# Patient Record
Sex: Female | Born: 1950
Health system: Southern US, Community
[De-identification: ages and names within clinical notes are randomized; demographics above are authoritative.]

## PROBLEM LIST (undated history)

## (undated) DIAGNOSIS — E039 Hypothyroidism, unspecified: Secondary | ICD-10-CM

## (undated) DIAGNOSIS — K648 Other hemorrhoids: Secondary | ICD-10-CM

## (undated) DIAGNOSIS — I10 Essential (primary) hypertension: Secondary | ICD-10-CM

## (undated) DIAGNOSIS — K6389 Other specified diseases of intestine: Secondary | ICD-10-CM

## (undated) DIAGNOSIS — R51 Headache: Secondary | ICD-10-CM

## (undated) HISTORY — DX: Headache: R51

## (undated) HISTORY — DX: Essential (primary) hypertension: I10

## (undated) HISTORY — DX: Other hemorrhoids: K64.8

## (undated) HISTORY — DX: Hypothyroidism, unspecified: E03.9

## (undated) HISTORY — DX: Other specified diseases of intestine: K63.89

## (undated) HISTORY — PX: DIAGNOSTIC MAMMOGRAM: HXRAD719

---

## 1975-11-02 HISTORY — PX: TUBAL LIGATION: SHX77

## 2001-08-30 ENCOUNTER — Ambulatory Visit (HOSPITAL_COMMUNITY): Admission: RE | Admit: 2001-08-30 | Discharge: 2001-08-30 | Payer: Self-pay | Admitting: *Deleted

## 2001-08-30 ENCOUNTER — Encounter: Payer: Self-pay | Admitting: *Deleted

## 2004-09-03 ENCOUNTER — Ambulatory Visit (HOSPITAL_COMMUNITY): Admission: RE | Admit: 2004-09-03 | Discharge: 2004-09-03 | Payer: Self-pay | Admitting: *Deleted

## 2005-01-14 ENCOUNTER — Ambulatory Visit (HOSPITAL_COMMUNITY): Admission: RE | Admit: 2005-01-14 | Discharge: 2005-01-14 | Payer: Self-pay | Admitting: Internal Medicine

## 2005-01-14 ENCOUNTER — Other Ambulatory Visit: Admission: RE | Admit: 2005-01-14 | Discharge: 2005-01-14 | Payer: Self-pay | Admitting: *Deleted

## 2006-02-17 ENCOUNTER — Ambulatory Visit (HOSPITAL_COMMUNITY): Admission: RE | Admit: 2006-02-17 | Discharge: 2006-02-17 | Payer: Self-pay | Admitting: Internal Medicine

## 2006-08-03 ENCOUNTER — Ambulatory Visit (HOSPITAL_COMMUNITY): Admission: RE | Admit: 2006-08-03 | Discharge: 2006-08-03 | Payer: Self-pay | Admitting: Internal Medicine

## 2006-08-03 ENCOUNTER — Ambulatory Visit: Payer: Self-pay | Admitting: Cardiology

## 2007-03-09 ENCOUNTER — Ambulatory Visit (HOSPITAL_COMMUNITY): Admission: RE | Admit: 2007-03-09 | Discharge: 2007-03-09 | Payer: Self-pay | Admitting: Internal Medicine

## 2008-03-22 ENCOUNTER — Ambulatory Visit (HOSPITAL_COMMUNITY): Admission: RE | Admit: 2008-03-22 | Discharge: 2008-03-22 | Payer: Self-pay | Admitting: Internal Medicine

## 2008-06-04 ENCOUNTER — Ambulatory Visit (HOSPITAL_COMMUNITY): Admission: RE | Admit: 2008-06-04 | Discharge: 2008-06-04 | Payer: Self-pay | Admitting: Gastroenterology

## 2008-06-04 ENCOUNTER — Ambulatory Visit: Payer: Self-pay | Admitting: Gastroenterology

## 2008-06-04 ENCOUNTER — Encounter: Payer: Self-pay | Admitting: Gastroenterology

## 2008-06-04 HISTORY — PX: COLONOSCOPY: SHX174

## 2008-07-03 ENCOUNTER — Ambulatory Visit (HOSPITAL_COMMUNITY): Admission: RE | Admit: 2008-07-03 | Discharge: 2008-07-03 | Payer: Self-pay | Admitting: Internal Medicine

## 2009-04-14 ENCOUNTER — Ambulatory Visit (HOSPITAL_COMMUNITY): Admission: RE | Admit: 2009-04-14 | Discharge: 2009-04-14 | Payer: Self-pay | Admitting: Internal Medicine

## 2009-06-17 ENCOUNTER — Encounter: Payer: Self-pay | Admitting: Adult Health

## 2009-06-26 ENCOUNTER — Ambulatory Visit (HOSPITAL_COMMUNITY): Admission: RE | Admit: 2009-06-26 | Discharge: 2009-06-26 | Payer: Self-pay | Admitting: Internal Medicine

## 2009-06-27 ENCOUNTER — Encounter: Payer: Self-pay | Admitting: Cardiology

## 2009-06-27 ENCOUNTER — Ambulatory Visit: Payer: Self-pay

## 2009-06-27 ENCOUNTER — Encounter: Payer: Self-pay | Admitting: Adult Health

## 2009-06-27 DIAGNOSIS — R0602 Shortness of breath: Secondary | ICD-10-CM

## 2009-06-27 DIAGNOSIS — R944 Abnormal results of kidney function studies: Secondary | ICD-10-CM

## 2009-06-27 DIAGNOSIS — R1310 Dysphagia, unspecified: Secondary | ICD-10-CM | POA: Insufficient documentation

## 2009-06-27 DIAGNOSIS — R072 Precordial pain: Secondary | ICD-10-CM | POA: Insufficient documentation

## 2009-06-27 DIAGNOSIS — R5381 Other malaise: Secondary | ICD-10-CM

## 2009-06-27 DIAGNOSIS — R5383 Other fatigue: Secondary | ICD-10-CM

## 2009-07-04 ENCOUNTER — Ambulatory Visit (HOSPITAL_COMMUNITY): Admission: RE | Admit: 2009-07-04 | Discharge: 2009-07-04 | Payer: Self-pay | Admitting: Cardiology

## 2009-07-04 ENCOUNTER — Encounter: Payer: Self-pay | Admitting: Cardiology

## 2009-07-04 ENCOUNTER — Ambulatory Visit: Payer: Self-pay | Admitting: Cardiology

## 2009-07-04 LAB — CONVERTED CEMR LAB
ALT: 33 units/L (ref 0–35)
Alkaline Phosphatase: 80 units/L (ref 39–117)
Basophils Absolute: 0 10*3/uL (ref 0.0–0.1)
Basophils Relative: 1 % (ref 0–1)
CO2: 22 meq/L (ref 19–32)
Cholesterol: 189 mg/dL (ref 0–200)
Creatinine, Ser: 0.92 mg/dL (ref 0.40–1.20)
Eosinophils Absolute: 0.1 10*3/uL (ref 0.0–0.7)
Glucose, Bld: 95 mg/dL (ref 70–99)
MCHC: 32.5 g/dL (ref 30.0–36.0)
MCV: 88.2 fL (ref 78.0–100.0)
Monocytes Relative: 10 % (ref 3–12)
Neutrophils Relative %: 47 % (ref 43–77)
RBC: 5.09 M/uL (ref 3.87–5.11)
RDW: 12.5 % (ref 11.5–15.5)
Total Bilirubin: 1.2 mg/dL (ref 0.3–1.2)
Total CHOL/HDL Ratio: 6.3
Triglycerides: 148 mg/dL (ref <150)
VLDL: 30 mg/dL (ref 0–40)

## 2009-07-09 ENCOUNTER — Encounter: Payer: Self-pay | Admitting: Cardiology

## 2009-07-10 ENCOUNTER — Encounter (INDEPENDENT_AMBULATORY_CARE_PROVIDER_SITE_OTHER): Payer: Self-pay

## 2009-07-11 ENCOUNTER — Ambulatory Visit: Payer: Self-pay | Admitting: Cardiology

## 2009-07-11 DIAGNOSIS — R079 Chest pain, unspecified: Secondary | ICD-10-CM

## 2009-07-16 ENCOUNTER — Ambulatory Visit (HOSPITAL_COMMUNITY): Admission: RE | Admit: 2009-07-16 | Discharge: 2009-07-16 | Payer: Self-pay | Admitting: Cardiology

## 2009-07-21 ENCOUNTER — Encounter (INDEPENDENT_AMBULATORY_CARE_PROVIDER_SITE_OTHER): Payer: Self-pay | Admitting: *Deleted

## 2009-11-01 HISTORY — PX: CATARACT EXTRACTION: SUR2

## 2009-11-25 LAB — HM PAP SMEAR: HM Pap smear: NORMAL

## 2010-03-17 ENCOUNTER — Encounter (INDEPENDENT_AMBULATORY_CARE_PROVIDER_SITE_OTHER): Payer: Self-pay | Admitting: *Deleted

## 2010-07-14 ENCOUNTER — Ambulatory Visit (HOSPITAL_COMMUNITY): Admission: RE | Admit: 2010-07-14 | Discharge: 2010-07-14 | Payer: Self-pay | Admitting: Ophthalmology

## 2010-08-05 ENCOUNTER — Encounter (INDEPENDENT_AMBULATORY_CARE_PROVIDER_SITE_OTHER): Payer: Self-pay | Admitting: *Deleted

## 2010-08-18 ENCOUNTER — Ambulatory Visit (HOSPITAL_COMMUNITY)
Admission: RE | Admit: 2010-08-18 | Discharge: 2010-08-18 | Payer: Self-pay | Source: Home / Self Care | Admitting: Ophthalmology

## 2010-08-21 ENCOUNTER — Ambulatory Visit (HOSPITAL_COMMUNITY): Admission: RE | Admit: 2010-08-21 | Discharge: 2010-08-21 | Payer: Self-pay | Admitting: Internal Medicine

## 2010-12-01 NOTE — Letter (Signed)
Summary: Appointment - Reminder 2  [facility] [place]. [address], [place] [address]   Phone: [phone]  Fax: [phone]     Mar 17, 2010 MRN: [id number]   [name] [address] [place], [place]  [address]   Dear Ms. [facility],  Our records indicate that it is time to schedule a follow-up appointment.  Dr. Diona Browner         recommended that you follow up with [place] in      12/2009      . It is very important that we reach you to schedule this appointment. We look forward to participating in your health care needs. Please contact us at the number listed above at your earliest convenience to schedule your appointment.  If you are unable to make an appointment at this time, give [place] a call so we can update our records.     Sincerely,   Glass blower/designer

## 2010-12-01 NOTE — Letter (Signed)
Summary: Appointment - Reminder 2  [facility] [place]. [address], [place] [address]   Phone: [phone]  Fax: [phone]     August 05, 2010 MRN: [id number]   [name] [address] [place], [place]  [address]   Dear Ms. [facility],  Our records indicate that it is time to schedule a follow-up appointment.  Dr.  Diona Browner        recommended that you follow up with [place] in   12/2009         . It is very important that we reach you to schedule this appointment. We look forward to participating in your health care needs. Please contact us at the number listed above at your earliest convenience to schedule your appointment.  If you are unable to make an appointment at this time, give [place] a call so we can update our records.     Sincerely,   Glass blower/designer

## 2011-03-16 NOTE — Op Note (Signed)
[name], [name]              ACCOUNT NO.:  [id number]   MEDICAL RECORD NO.:  [id number]          PATIENT TYPE:  AMB   LOCATION:  DAY                           FACILITY:  [facility]   PHYSICIAN:  Kassie Mends, M.D.      DATE OF BIRTH:  December 28, 1950   DATE OF PROCEDURE:  06/04/2008  DATE OF DISCHARGE:                               OPERATIVE REPORT   REFERRING PHYSICIAN:  Tesfaye D. Fanta, MD.   PROCEDURE:  Colonoscopy with cold forceps and snare cautery polypectomy.   INDICATIONS FOR EXAMINATION:  [name] is a  60 year old female who  presents for average risk colon cancer screening.   FINDINGS:  1. A 1-cm flat ascending colon polyp removed via snare cautery.  A 4-      mm sessile, ascending polyp removed via cold forceps.  Otherwise,      no masses, inflammatory changes, diverticular AVM seen.  2. Melanosis coli.  3. Moderate internal hemorrhoids.  Otherwise, normal retroflexed view      of the rectum.   RECOMMENDATIONS:  1. Will call Amy. Avila with the results of her biopsies.  If she has      evidence of advanced features in her adenoma then she should have      the screening colonoscopy in 3 years.  2. She should follow a high-fiber diet.  She is given a handout on      high-fiber diet, polyps, and hemorrhoids.  3. No aspirin, NSAIDs, or anticoagulation for 7 days.   MEDICATIONS:  1. Demerol 25 mg IV.  2. Versed 3 mg IV.   PROCEDURE TECHNIQUE:  Physical exam was performed.  Informed consent was  obtained from the patient, after explaining the benefits, risks, and  alternatives to the procedure.  The patient was connected to the monitor  and placed in the left lateral position.  Continuous oxygen was provided  by nasal cannula.  IV medicine administered through an indwelling  cannula.  After administration of sedation and rectal exam, the  patient's rectum was intubated and the scope was advanced under direct  visualization to the cecum.  The scope was removed slowly by  carefully  examining the color, texture, anatomy, and integrity of the mucosa on  the way out.  The patient was recovered in endoscopy and discharged home  in satisfactory condition.   PATH:  Tubulovillous adenoma. TCS in 3 years. High fiber diet.      Kassie Mends, M.D.  Electronically Signed     SM/MEDQ  D:  06/04/2008  T:  06/05/2008  Job:  [id number]   cc:   Tesfaye D. Felecia Shelling, MD  Fax: [phone]

## 2011-03-19 NOTE — Procedures (Signed)
[name], [name] NO.:  [id number]   MEDICAL RECORD NO.:  [id number]          PATIENT TYPE:  OUT   LOCATION:  RAD                           FACILITY:  [facility]   PHYSICIAN:  Gerrit Friends. Dietrich Pates, MD, FACCDATE OF BIRTH:  1951/02/10   DATE OF PROCEDURE:  08/03/2006  DATE OF DISCHARGE:                                  ECHOCARDIOGRAM   CLINICAL DATA:  This is a 60 year old woman with dyspnea.   M-Mode:  Aorta 2.9, left atrium 4.5, septum 1.5, posterior wall 1.0, LV  diastole 3.8, LV systole 2.8.   1. Technically suboptimal but adequate echocardiographic study.  2. Mild right and left atrial enlargement; normal right ventricular size      and function.  3. Normal trileaflet aortic valve; mild-to-moderate calcification of the      proximal ascending aorta.  4. Normal mitral valve; minimal annular calcification.  5. Normal tricuspid valve; physiologic regurgitation; normal estimated RV      systolic pressure.  6. Normal pulmonic valve and proximal pulmonary artery.  7. Normal left ventricular size; mild hypertrophy with disproportionate      septal thickening; normal regional and global function.  8. IVC is grossly normal.      Gerrit Friends. Dietrich Pates, MD, [facility]  Electronically Signed     RMR/MEDQ  D:  08/03/2006  T:  08/04/2006  Job:  [id number]

## 2011-07-08 ENCOUNTER — Ambulatory Visit (HOSPITAL_COMMUNITY)
Admission: RE | Admit: 2011-07-08 | Discharge: 2011-07-08 | Disposition: A | Discharge status: Home / Self Care | Payer: 59 | Source: Ambulatory Visit | Attending: Orthopaedic Surgery | Admitting: Orthopaedic Surgery

## 2011-07-08 DIAGNOSIS — M546 Pain in thoracic spine: Secondary | ICD-10-CM | POA: Insufficient documentation

## 2011-07-08 DIAGNOSIS — M25519 Pain in unspecified shoulder: Secondary | ICD-10-CM | POA: Insufficient documentation

## 2011-07-08 DIAGNOSIS — M6281 Muscle weakness (generalized): Secondary | ICD-10-CM | POA: Insufficient documentation

## 2011-07-08 DIAGNOSIS — M25619 Stiffness of unspecified shoulder, not elsewhere classified: Secondary | ICD-10-CM | POA: Insufficient documentation

## 2011-07-08 DIAGNOSIS — IMO0001 Reserved for inherently not codable concepts without codable children: Secondary | ICD-10-CM | POA: Insufficient documentation

## 2011-07-08 NOTE — Progress Notes (Cosign Needed)
Physical Therapy Evaluation  Patient Details  Name: [name] MRN: [id number] Date of Birth: 01/10/1951  Today's Date: 07/08/2011 Time: 1600-1700 Time Calculation (min): 60 min Visit#: 1 of 4 Re-eval: 08/07/11  Past Medical History: No past medical history on file. Past Surgical History: No past surgical history on file.  Subjective Symptoms/Limitations Symptoms: [name] states that she is having a knawing ache under her left shoulder blade since the first week of July.  She states since July she has only had three days of being painfree.  The patient states that she had her heart checked out and all tests were negative.  The patient states that she was put on anti'inflammatory  with 20% relief.  The pat states that she was given a muscle relaxor from Dr. Hilda Avila which gave her 60-70 % relief. How long can you sit comfortably?: no problem How long can you stand comfortably?: no problem How long can you walk comfortably?: no problem Pain Assessment Currently in Pain?: Yes Pain Location: Shoulder (shoulderblade.) Pain Orientation: Left Pain Relieving Factors: medication.l  Precautions/Restrictions  Restrictions Weight Bearing Restrictions: No  Prior Functioning  Home Living Type of Home: House Lives With: Spouse Prior Function Level of Independence: Independent with basic ADLs Driving: Yes Able to Take Stairs Reciprically: Yes Vocation: Full time employment Leisure: Hobbies-no  Cognition Cognition Overall Cognitive Status: Appears within functional limits for tasks assessed Arousal/Alertness: Awake/alert Orientation Level: Oriented X4  Sensation/Coordination/Flexibility    Assessment LUE Strength Left Shoulder Flexion: 4/5 Left Shoulder ABduction: 4/5 Left Shoulder Internal Rotation: 4/5 Left Shoulder External Rotation: 3+/5 RLE Assessment RLE Assessment: Within Functional Limits  Mobility (including Balance)       Exercise/Treatments- Pt  completed right side-lying abduction,ER; prone shld retraction and self mob with dowel. Modalities Modalities: Ultrasound Manual Therapy Manual Therapy: Scapular mobilization Scapular Mobilization: mob to T5-6 Ultrasound Ultrasound Location: LT5-7 Ultrasound Parameters: 1mg HZ 1.3 w/cm2 c 8' Ultrasound Goals: Pain  Physical Therapy Assessment and Plan PT Assessment and Plan Clinical Impression Statement: Pt with mild paraspinal spasm with decreased rotation of T5-6 who will benefit from skilled therapy to reduce pain Rehab Potential: Good PT Frequency: Min 1X/week PT Duration: 4 weeks PT Treatment/Interventions: Therapeutic exercise;Other (comment) (manual and modalities for pain) PT Plan: Begin chest stretch, T-Band exercise and chest stretch next treatment.    Goals PT Short Term Goals Time to Complete Goals: 2 weeks PT Short Term Goal 1: I HEP PT Short Term Goal 2: Pain decreased to no more than an 8/10 PT Long Term Goals PT Long Term Goal 1: 4 wk- pain no higher than a 2/10 PT Long Term Goal 2: strength wnl Long Term Goal 3: I in advance HEP  Problem List Patient Active Problem List  Diagnoses  . PE  . FATIGUE / MALAISE  . DYSPNEA  . CHEST PAIN  . CHEST PAIN-PRECORDIAL  . DYSPHAGIA UNSPECIFIED  . NONSPECIFIC ABNORM RESULTS KIDNEY FUNCTION STUDY  . PREHYPERTENSION  . Pain in thoracic spine    PT - End of Session Activity Tolerance: Patient tolerated treatment well General Behavior During Session: [facility] for tasks performed Cognition: [facility] for tasks performed   Mclain Freer,CINDY 07/08/2011, 5:15 PM  Physician Documentation Your signature is required to indicate approval of the treatment plan as stated above.  Please sign and either send electronically or make a copy of this report for your files and return this physician signed original.   Please mark one 1.__approve of plan  2. ___approve of plan with the  following conditions.   ______________________________                                                           _____________________ Physician Signature                                                                                                             Date

## 2011-07-08 NOTE — Patient Instructions (Addendum)
HEP

## 2011-07-15 ENCOUNTER — Inpatient Hospital Stay (HOSPITAL_COMMUNITY): Admission: RE | Admit: 2011-07-15 | Payer: 59 | Source: Ambulatory Visit | Admitting: Physical Therapy

## 2011-07-15 ENCOUNTER — Telehealth (HOSPITAL_COMMUNITY): Payer: Self-pay

## 2011-07-16 ENCOUNTER — Ambulatory Visit (HOSPITAL_COMMUNITY): Payer: 59 | Admitting: Physical Therapy

## 2011-07-23 ENCOUNTER — Inpatient Hospital Stay (HOSPITAL_COMMUNITY): Admission: RE | Admit: 2011-07-23 | Payer: 59 | Source: Ambulatory Visit | Admitting: Physical Therapy

## 2011-07-30 ENCOUNTER — Telehealth (HOSPITAL_COMMUNITY): Payer: Self-pay

## 2011-07-30 ENCOUNTER — Inpatient Hospital Stay (HOSPITAL_COMMUNITY): Admission: RE | Admit: 2011-07-30 | Payer: 59 | Source: Ambulatory Visit | Admitting: Physical Therapy

## 2011-08-02 LAB — HM MAMMOGRAPHY: HM Mammogram: NEGATIVE

## 2011-09-08 ENCOUNTER — Other Ambulatory Visit (HOSPITAL_COMMUNITY): Payer: Self-pay | Admitting: Internal Medicine

## 2011-09-08 DIAGNOSIS — Z1231 Encounter for screening mammogram for malignant neoplasm of breast: Secondary | ICD-10-CM

## 2011-09-14 ENCOUNTER — Emergency Department (HOSPITAL_COMMUNITY)
Admission: EM | Admit: 2011-09-14 | Discharge: 2011-09-14 | Disposition: A | Discharge status: Home / Self Care | Payer: 59 | Source: Home / Self Care | Attending: Family Medicine | Admitting: Family Medicine

## 2011-09-14 ENCOUNTER — Emergency Department (INDEPENDENT_AMBULATORY_CARE_PROVIDER_SITE_OTHER): Payer: 59

## 2011-09-14 ENCOUNTER — Encounter: Payer: Self-pay | Admitting: Cardiology

## 2011-09-14 DIAGNOSIS — IMO0001 Reserved for inherently not codable concepts without codable children: Secondary | ICD-10-CM

## 2011-09-14 DIAGNOSIS — M791 Myalgia, unspecified site: Secondary | ICD-10-CM

## 2011-09-14 MED ORDER — HYDROCODONE-ACETAMINOPHEN 5-325 MG PO TABS: ORAL_TABLET | ORAL | Status: AC

## 2011-09-14 MED ORDER — NAPROXEN 375 MG PO TABS: 375.0000 mg | ORAL_TABLET | Freq: Two times a day (BID) | ORAL | Status: DC

## 2011-09-14 NOTE — ED Notes (Signed)
Pt co back pain today that started in August that has gotten progressively worse. Pain goes into left shoulder blade into left breast. Pt reports constipation. Pt able to do ADL's but the gets worse by the end of the day.

## 2011-09-14 NOTE — ED Provider Notes (Signed)
History     CSN: [id number] Arrival date & time: 09/14/2011  5:30 PM   First MD Initiated Contact with Patient 09/14/11 1814      No chief complaint on file.   (Consider location/radiation/quality/duration/timing/severity/associated sxs/prior treatment) HPI Comments: Jashayla presents for evaluation of LEFT shoulder pain, shoulder blade pain, that has been worsening over time. She relates the onset of pain to carrying a "homemade pole" that she was carrying during exercises. She denies any numbness and tingling.  She states that the pain now radiates through to her LEFT breast, described as sharp, shooting.  Patient is a 60 y.o. female presenting with back pain.  Back Pain  This is a new problem. The current episode started more than 1 week ago. The problem occurs constantly. The pain is associated with no known injury. The quality of the pain is described as stabbing. The pain is at a severity of 9/10. The pain is severe. The symptoms are aggravated by certain positions. The pain is the same all the time. Associated symptoms include abdominal pain. Pertinent negatives include no paresthesias, no paresis, no tingling and no weakness. She has tried analgesics for the symptoms. The treatment provided moderate relief.    No past medical history on file.  No past surgical history on file.  No family history on file.  History  Substance Use Topics  . Smoking status: Not on file  . Smokeless tobacco: Not on file  . Alcohol Use: Not on file    OB History    No data available      Review of Systems  Constitutional: Negative.   HENT: Negative.   Eyes: Negative.   Respiratory: Negative.   Gastrointestinal: Positive for abdominal pain.  Musculoskeletal: Positive for back pain.  Neurological: Negative for tingling, weakness and paresthesias.    Allergies  Review of patient's allergies indicates not on file.  Home Medications  No current outpatient prescriptions on file.  BP  120/84  Pulse 76  Temp(Src) 98.1 F (36.7 C) (Oral)  Resp 18  SpO2 100%  Physical Exam  Constitutional: She is oriented to person, place, and time.  HENT:  Head: Normocephalic and atraumatic.  Pulmonary/Chest: Effort normal and breath sounds normal. She has no wheezes. She has no rhonchi. She has no rales.  Musculoskeletal:       Left shoulder: She exhibits normal range of motion, no tenderness, no bony tenderness and normal strength.       Thoracic back: She exhibits tenderness, bony tenderness and pain.       LEFT shoulder: full abduction, adduction, flexion, and extension; 5/5 strength with internal and external rotation; 5/5 grip strength; negative Hawkin's, negative Ober's, negative Neer's, negative Speed's test  Tenderness to palpation over lower ribs beneath LEFT scapula  Neurological: She is alert and oriented to person, place, and time.  Skin: Skin is warm and dry.    ED Course  Procedures (including critical care time)  Labs Reviewed - No data to display No results found.   No diagnosis found.    MDM  Xray: no acute findings; no evidence of fracture or cardiopulmonary disease        Richardo Priest, MD 09/14/11 1944

## 2011-10-08 ENCOUNTER — Ambulatory Visit (HOSPITAL_COMMUNITY)
Admission: RE | Admit: 2011-10-08 | Discharge: 2011-10-08 | Disposition: A | Discharge status: Home / Self Care | Payer: 59 | Source: Ambulatory Visit | Attending: Internal Medicine | Admitting: Internal Medicine

## 2011-10-08 DIAGNOSIS — Z1231 Encounter for screening mammogram for malignant neoplasm of breast: Secondary | ICD-10-CM | POA: Insufficient documentation

## 2011-10-29 ENCOUNTER — Encounter: Payer: Self-pay | Admitting: Cardiology

## 2011-12-06 ENCOUNTER — Telehealth: Payer: Self-pay

## 2011-12-06 NOTE — Telephone Encounter (Signed)
Pt referred from Dr. Felecia Shelling for colonoscopy. Hx of tubular adenoma  06/04/2008  ( Dr. Darrick Penna). LMOM that pt will need OV prior to colonoscopy. LMOM to call.

## 2011-12-09 ENCOUNTER — Ambulatory Visit (INDEPENDENT_AMBULATORY_CARE_PROVIDER_SITE_OTHER): Payer: 59 | Admitting: Gastroenterology

## 2011-12-09 ENCOUNTER — Encounter: Payer: Self-pay | Admitting: Gastroenterology

## 2011-12-09 VITALS — BP 108/73 | HR 74 | Temp 98.2°F | Ht 67.0 in | Wt 184.6 lb

## 2011-12-09 DIAGNOSIS — K5900 Constipation, unspecified: Secondary | ICD-10-CM | POA: Insufficient documentation

## 2011-12-09 DIAGNOSIS — Z86010 Personal history of colonic polyps: Secondary | ICD-10-CM | POA: Insufficient documentation

## 2011-12-09 MED ORDER — POLYETHYLENE GLYCOL 3350 17 GM/SCOOP PO POWD: 17.0000 g | Freq: Every day | ORAL | Status: AC

## 2011-12-09 MED ORDER — PEG-KCL-NACL-NASULF-NA ASC-C 100 G PO SOLR: 1.0000 | Freq: Once | ORAL | Status: DC

## 2011-12-09 NOTE — Patient Instructions (Signed)
We have scheduled you for a colonoscopy. Please see separate instructions.  Please start Miralax 17g daily. First three days, take twice daily. I sent RX to [facility] but you may be instructed to buy OTC.  Constipation Constipation is when you poop (have a bowel movement) less than 3 times a week. Sometimes the poop (stool) is small and hard. There are many different causes of constipation.  HOME CARE   Go to the bathroom when you feel the urge to go.   Drink enough fluid to keep your pee (urine) clear or pale yellow.   Eat more high-fiber foods.   Drink prune juice or eat stewed fruits in the morning.   Exercise.   Ask your doctor if you should take medicine to help you poop or take fiber supplements.  GET HELP RIGHT AWAY IF:   You see blood in your poop or in the toilet.   Your belly (abdomen) gets hard and puffy (swollen).   You keep throwing up (vomiting).   You have a lot of pain.  MAKE SURE YOU:   Understand these instructions.   Will watch your condition.   Will get help right away if you are not doing well or get worse.  Document Released: 04/05/2008 Document Revised: 06/30/2011 Document Reviewed: 04/05/2008 [facility] Patient Information 2012 [place], [place].

## 2011-12-09 NOTE — Assessment & Plan Note (Signed)
Add miralax 17g po bid for three days, then daily. HO provided. Colonoscopy as planned.

## 2011-12-09 NOTE — Progress Notes (Signed)
Primary Care Physician:  Avon Gully, MD, MD  Primary Gastroenterologist:  Jonette Eva, MD   Chief Complaint  Patient presents with  . Colonoscopy    not feling well    HPI:  [name] is a 61 y.o. female here to schedule her surveillance colonoscopy. She had tubulovillous adenoma removed from ascending colon in 2009. Recommend to have three year follow-up TCS.  Weight down 13 pounds in couple of months. Only change is that she eliminated soft drinks. Not feeling well for last couple of months. Bowels will not empting well, sore in rectum, constipation. No naproxen since summer. Recent TSH ok. Chronic constipation, but now really bad. May go all week until takes Smooth Move Green Tea on Thursday. After good BMs still feels bad. No melena, brbpr, n/v, heartburn, dysphagia. LLQ pain.    Current Outpatient Prescriptions  Medication Sig Dispense Refill  . HYDROcodone-acetaminophen (NORCO) 5-325 MG per tablet Take 1 tablet by mouth every 6 (six) hours as needed.        Marland Kitchen levothyroxine (SYNTHROID, LEVOTHROID) 112 MCG tablet Take 112 mcg by mouth daily.        Marland Kitchen lisinopril-hydrochlorothiazide (PRINZIDE,ZESTORETIC) 10-12.5 MG per tablet Take 1 tablet by mouth daily.        .       11    Allergies as of 12/09/2011  . (No Known Allergies)    Past Medical History  Diagnosis Date  . Hypothyroidism   . Tubulovillous adenoma 06/2008    1cm flat ascending colon  . Internal hemorrhoids   . Melanosis coli   . Headache   . HTN (hypertension)     Past Surgical History  Procedure Date  . Tubal ligation 1977  . Vaginal delivery   . Diagnostic mammogram   . Colonoscopy 06/04/08    moderate internal hemorrhoids/melanosis coli/tubulovillous adenoma 1cm flat    Family History  Problem Relation Age of Onset  . Colon cancer Neg Hx   . Breast cancer Sister     two, one deceased  . Pancreatic cancer Mother     deceased, 84  . Lung cancer Neg Hx   . Ovarian cancer Neg Hx      History   Social History  . Marital Status: Widowed    Spouse Name: N/A    Number of Children: 1  . Years of Education: N/A   Occupational History  . Customer service [place]   Social History Main Topics  . Smoking status: Never Smoker   . Smokeless tobacco: Not on file  . Alcohol Use: No  . Drug Use: No  . Sexually Active:    Other Topics Concern  . Not on file   Social History Narrative  . No narrative on file      ROS:  General: Negative for anorexia, weight loss, fever, chills, fatigue, weakness. Eyes: Negative for vision changes.  ENT: Negative for hoarseness, difficulty swallowing , nasal congestion. CV: Negative for chest pain, angina, palpitations, dyspnea on exertion, peripheral edema.  Respiratory: Negative for dyspnea at rest, dyspnea on exertion, cough, sputum, wheezing.  GI: See history of present illness. GU:  Negative for dysuria, hematuria, urinary incontinence, urinary frequency, nocturnal urination.  MS: Negative for joint pain, low back pain.  Derm: Negative for rash or itching.  Neuro: Negative for weakness, abnormal sensation, seizure, frequent headaches, memory loss, confusion.  Psych: Negative for anxiety, depression, suicidal ideation, hallucinations.  Endo: Negative for unusual weight change.  Heme: Negative for bruising or  bleeding. Allergy: Negative for rash or hives.    Physical Examination:  BP 108/73  Pulse 74  Temp(Src) 98.2 F (36.8 C) (Temporal)  Ht 5\' 7"  (1.702 m)  Wt 184 lb 9.6 oz (83.734 kg)  BMI 28.91 kg/m2   General: Well-nourished, well-developed in no acute distress.  Head: Normocephalic, atraumatic.   Eyes: Conjunctiva pink, no icterus. Mouth: Oropharyngeal mucosa moist and pink , no lesions erythema or exudate. Neck: Supple without thyromegaly, masses, or lymphadenopathy.  Lungs: Clear to auscultation bilaterally.  Heart: Regular rate and rhythm, no murmurs rubs or gallops.  Abdomen: Bowel sounds are  normal, mild LLQ tenderness, nondistended, no hepatosplenomegaly or masses, no abdominal bruits or hernia , no rebound or guarding.   Rectal: Defer to time of TCS. Extremities: No lower extremity edema. No clubbing or deformities.  Neuro: Alert and oriented x 4 , grossly normal neurologically.  Skin: Warm and dry, no rash or jaundice.   Psych: Alert and cooperative, normal mood and affect.

## 2011-12-09 NOTE — Assessment & Plan Note (Signed)
Due for surveillance colonoscopy.  I have discussed the risks, alternatives, benefits with regards to but not limited to the risk of reaction to medication, bleeding, infection, perforation and the patient is agreeable to proceed. Written consent to be obtained.  

## 2011-12-09 NOTE — Progress Notes (Signed)
Faxed to PCP

## 2011-12-16 MED ORDER — SODIUM CHLORIDE 0.45 % IV SOLN
Freq: Once | INTRAVENOUS | Status: AC
  Administered 2011-12-17: 12:00:00 via INTRAVENOUS

## 2011-12-17 ENCOUNTER — Encounter (HOSPITAL_COMMUNITY)
Admission: RE | Disposition: A | Discharge status: Home / Self Care | Payer: Self-pay | Source: Ambulatory Visit | Attending: Gastroenterology

## 2011-12-17 ENCOUNTER — Ambulatory Visit (HOSPITAL_COMMUNITY)
Admission: RE | Admit: 2011-12-17 | Discharge: 2011-12-17 | Disposition: A | Discharge status: Home / Self Care | Payer: 59 | Source: Ambulatory Visit | Attending: Gastroenterology | Admitting: Gastroenterology

## 2011-12-17 ENCOUNTER — Other Ambulatory Visit: Payer: Self-pay | Admitting: Gastroenterology

## 2011-12-17 ENCOUNTER — Encounter (HOSPITAL_COMMUNITY): Payer: Self-pay | Admitting: *Deleted

## 2011-12-17 DIAGNOSIS — R634 Abnormal weight loss: Secondary | ICD-10-CM | POA: Insufficient documentation

## 2011-12-17 DIAGNOSIS — R198 Other specified symptoms and signs involving the digestive system and abdomen: Secondary | ICD-10-CM

## 2011-12-17 DIAGNOSIS — Z79899 Other long term (current) drug therapy: Secondary | ICD-10-CM | POA: Insufficient documentation

## 2011-12-17 DIAGNOSIS — D126 Benign neoplasm of colon, unspecified: Secondary | ICD-10-CM | POA: Insufficient documentation

## 2011-12-17 DIAGNOSIS — Z860100 Personal history of colon polyps, unspecified: Secondary | ICD-10-CM | POA: Insufficient documentation

## 2011-12-17 DIAGNOSIS — I10 Essential (primary) hypertension: Secondary | ICD-10-CM | POA: Insufficient documentation

## 2011-12-17 DIAGNOSIS — K648 Other hemorrhoids: Secondary | ICD-10-CM

## 2011-12-17 HISTORY — PX: COLONOSCOPY: SHX5424

## 2011-12-17 SURGERY — COLONOSCOPY
Anesthesia: Moderate Sedation

## 2011-12-17 MED ORDER — MIDAZOLAM HCL 5 MG/5ML IJ SOLN
INTRAMUSCULAR | Status: DC | PRN
  Administered 2011-12-17 (×2): 1 mg via INTRAVENOUS
  Administered 2011-12-17: 2 mg via INTRAVENOUS

## 2011-12-17 MED ORDER — LUBIPROSTONE 24 MCG PO CAPS: ORAL_CAPSULE | ORAL | Status: AC

## 2011-12-17 MED ORDER — STERILE WATER FOR IRRIGATION IR SOLN
Status: DC | PRN
  Administered 2011-12-17: 12:00:00

## 2011-12-17 MED ORDER — MEPERIDINE HCL 100 MG/ML IJ SOLN
INTRAMUSCULAR | Status: DC | PRN
  Administered 2011-12-17: 25 mg via INTRAVENOUS

## 2011-12-17 MED ORDER — MEPERIDINE HCL 100 MG/ML IJ SOLN
INTRAMUSCULAR | Status: AC
  Filled 2011-12-17: qty 2

## 2011-12-17 MED ORDER — MIDAZOLAM HCL 5 MG/5ML IJ SOLN
INTRAMUSCULAR | Status: AC
  Filled 2011-12-17: qty 10

## 2011-12-17 NOTE — Op Note (Signed)
[facility] [address] [place], [place]  [address]  COLONOSCOPY PROCEDURE REPORT  PATIENT:  Amy, Avila  MR#:  [id number] BIRTHDATE:  09/04/1951, 60 yrs. old  GENDER:  female  ENDOSCOPIST:  Jonette Eva, MD REF. BY:  Glenice Laine, M.D. ASSISTANT:  PROCEDURE DATE:  12/17/2011 PROCEDURE:  ILEOColonoscopy with biopsy  INDICATIONS:  CHANGE IN BOWEL HABITS, WEIGHT LOSS, XB:JYNWGNFA POLYP 2009  MEDICATIONS:   Demerol 25 mg IV, Versed 4 mg IV  DESCRIPTION OF PROCEDURE:    Physical exam was performed. Informed consent was obtained from the patient after explaining the benefits, risks, and alternatives to procedure.  The patient was connected to monitor and placed in left lateral position. Continuous oxygen was provided by nasal cannula and IV medicine administered through an indwelling cannula.  After administration of sedation and rectal exam, the patient's rectum was intubated and the EC-[id number] ([id number]) colonoscope was advanced under direct visualization to the ILEUM.  The scope was removed slowly by carefully examining the color, texture, anatomy, and integrity mucosa on the way out.  The patient was recovered in endoscopy and discharged home in satisfactory condition. <<PROCEDUREIMAGES>>  FINDINGS:  A 4 MM sessile polyp was found in the descending colon 7 REMOVED VIA COLD FORCEPS. SMALL  Internal Hemorrhoids were found. NL ILEUM  10 CM VISUALIZED.  PREP QUALITY: EXCELLENT CECAL W/D TIME:     13 minutes  COMPLICATIONS:    None  ENDOSCOPIC IMPRESSION: 1) Sessile polyp in the descending colon 2) Internal hemorrhoids  RECOMMENDATIONS: TCS IN 5 YEARS HIGH FIBER DIET ADD AMITIZA AWAIT BIOPSIES OPV IN 4 MOS DRINK WATER  REPEAT EXAM:  No  ______________________________ Jonette Eva, MD  CC:  Glenice Laine, M.D.  n. eSIGNED:   Shakiah Wester at 12/17/2011 02:02 PM  Raford Pitcher, [phone]

## 2011-12-17 NOTE — Interval H&P Note (Signed)
History and Physical Interval Note:  12/17/2011 12:26 PM  [name]  has presented today for surgery, with the diagnosis of hx of polyps, constipation  The various methods of treatment have been discussed with the patient and family. After consideration of risks, benefits and other options for treatment, the patient has consented to  Procedure(s) (LRB): COLONOSCOPY (N/A) as a surgical intervention .  The patients' history has been reviewed, patient examined, no change in status, stable for surgery.  I have reviewed the patients' chart and labs.  Questions were answered to the patient's satisfaction.     [employer]

## 2011-12-17 NOTE — H&P (View-Only) (Signed)
Primary Care Physician:  FANTA,TESFAYE, MD, MD  Primary Gastroenterologist:  Sandi Fields, MD   Chief Complaint  Patient presents with  . Colonoscopy    not feling well    HPI:  [name] is a 61 y.o. female here to schedule her surveillance colonoscopy. She had tubulovillous adenoma removed from ascending colon in 2009. Recommend to have three year follow-up TCS.  Weight down 13 pounds in couple of months. Only change is that she eliminated soft drinks. Not feeling well for last couple of months. Bowels will not empting well, sore in rectum, constipation. No naproxen since summer. Recent TSH ok. Chronic constipation, but now really bad. May go all week until takes Smooth Move Green Tea on Thursday. After good BMs still feels bad. No melena, brbpr, n/v, heartburn, dysphagia. LLQ pain.    Current Outpatient Prescriptions  Medication Sig Dispense Refill  . HYDROcodone-acetaminophen (NORCO) 5-325 MG per tablet Take 1 tablet by mouth every 6 (six) hours as needed.        . levothyroxine (SYNTHROID, LEVOTHROID) 112 MCG tablet Take 112 mcg by mouth daily.        . lisinopril-hydrochlorothiazide (PRINZIDE,ZESTORETIC) 10-12.5 MG per tablet Take 1 tablet by mouth daily.        .       11    Allergies as of 12/09/2011  . (No Known Allergies)    Past Medical History  Diagnosis Date  . Hypothyroidism   . Tubulovillous adenoma 06/2008    1cm flat ascending colon  . Internal hemorrhoids   . Melanosis coli   . Headache   . HTN (hypertension)     Past Surgical History  Procedure Date  . Tubal ligation 1977  . Vaginal delivery   . Diagnostic mammogram   . Colonoscopy 06/04/08    moderate internal hemorrhoids/melanosis coli/tubulovillous adenoma 1cm flat    Family History  Problem Relation Age of Onset  . Colon cancer Neg Hx   . Breast cancer Sister     two, one deceased  . Pancreatic cancer Mother     deceased, 85  . Lung cancer Neg Hx   . Ovarian cancer Neg Hx      History   Social History  . Marital Status: Widowed    Spouse Name: N/A    Number of Children: 1  . Years of Education: N/A   Occupational History  . Customer service [employer]   Social History Main Topics  . Smoking status: Never Smoker   . Smokeless tobacco: Not on file  . Alcohol Use: No  . Drug Use: No  . Sexually Active:    Other Topics Concern  . Not on file   Social History Narrative  . No narrative on file      ROS:  General: Negative for anorexia, weight loss, fever, chills, fatigue, weakness. Eyes: Negative for vision changes.  ENT: Negative for hoarseness, difficulty swallowing , nasal congestion. CV: Negative for chest pain, angina, palpitations, dyspnea on exertion, peripheral edema.  Respiratory: Negative for dyspnea at rest, dyspnea on exertion, cough, sputum, wheezing.  GI: See history of present illness. GU:  Negative for dysuria, hematuria, urinary incontinence, urinary frequency, nocturnal urination.  MS: Negative for joint pain, low back pain.  Derm: Negative for rash or itching.  Neuro: Negative for weakness, abnormal sensation, seizure, frequent headaches, memory loss, confusion.  Psych: Negative for anxiety, depression, suicidal ideation, hallucinations.  Endo: Negative for unusual weight change.  Heme: Negative for bruising or   bleeding. Allergy: Negative for rash or hives.    Physical Examination:  BP 108/73  Pulse 74  Temp(Src) 98.2 F (36.8 C) (Temporal)  Ht 5' 7" (1.702 m)  Wt 184 lb 9.6 oz (83.734 kg)  BMI 28.91 kg/m2   General: Well-nourished, well-developed in no acute distress.  Head: Normocephalic, atraumatic.   Eyes: Conjunctiva pink, no icterus. Mouth: Oropharyngeal mucosa moist and pink , no lesions erythema or exudate. Neck: Supple without thyromegaly, masses, or lymphadenopathy.  Lungs: Clear to auscultation bilaterally.  Heart: Regular rate and rhythm, no murmurs rubs or gallops.  Abdomen: Bowel sounds are  normal, mild LLQ tenderness, nondistended, no hepatosplenomegaly or masses, no abdominal bruits or hernia , no rebound or guarding.   Rectal: Defer to time of TCS. Extremities: No lower extremity edema. No clubbing or deformities.  Neuro: Alert and oriented x 4 , grossly normal neurologically.  Skin: Warm and dry, no rash or jaundice.   Psych: Alert and cooperative, normal mood and affect.      

## 2011-12-23 ENCOUNTER — Encounter (HOSPITAL_COMMUNITY): Payer: Self-pay | Admitting: Gastroenterology

## 2011-12-23 NOTE — Telephone Encounter (Signed)
Called pt and LMOM for her to call and schedule an OV first.

## 2011-12-24 ENCOUNTER — Ambulatory Visit: Payer: 59 | Admitting: Gastroenterology

## 2011-12-24 ENCOUNTER — Telehealth: Payer: Self-pay | Admitting: Gastroenterology

## 2011-12-24 NOTE — Telephone Encounter (Signed)
Please call pt. She had A simple adenomas removed from her colon.   USE AMITIZA TO TREAT CONSTIPATION. USE MIRALAX AS NEEDED. FOLLOW A HIGH FIBER DIET. AVOID ITEMS THAT CAUSE BLOATING.  DRINK 6 TO 8 CUPS WATER DAILY. COLONOSCOPY IN 5 YEARS. OPV IN 4 MOS.

## 2011-12-27 NOTE — Telephone Encounter (Signed)
LMOM to call.

## 2011-12-28 NOTE — Telephone Encounter (Signed)
Pt had OV on 12/09/2011 and procedure on 12/17/2011.

## 2011-12-28 NOTE — Telephone Encounter (Signed)
LMOM to call. Letter mailed to call also.  

## 2011-12-29 NOTE — Telephone Encounter (Signed)
Results Cc to PCP & reminders are in the computer

## 2011-12-29 NOTE — Progress Notes (Signed)
REVIEWED.  

## 2012-01-24 LAB — HM COLONOSCOPY

## 2012-02-24 ENCOUNTER — Encounter: Payer: Self-pay | Admitting: Internal Medicine

## 2012-02-24 ENCOUNTER — Ambulatory Visit (INDEPENDENT_AMBULATORY_CARE_PROVIDER_SITE_OTHER): Payer: 59 | Admitting: Internal Medicine

## 2012-02-24 VITALS — BP 110/70

## 2012-02-24 DIAGNOSIS — E039 Hypothyroidism, unspecified: Secondary | ICD-10-CM | POA: Insufficient documentation

## 2012-02-24 DIAGNOSIS — Z0000 Encounter for general adult medical examination without abnormal findings: Secondary | ICD-10-CM

## 2012-02-24 DIAGNOSIS — I10 Essential (primary) hypertension: Secondary | ICD-10-CM

## 2012-02-24 LAB — CBC WITH DIFFERENTIAL/PLATELET
Eosinophils Relative: 2.8 % (ref 0.0–5.0)
HCT: 43.9 % (ref 36.0–46.0)
Hemoglobin: 14.4 g/dL (ref 12.0–15.0)
Lymphs Abs: 2.2 10*3/uL (ref 0.7–4.0)
MCV: 89.5 fl (ref 78.0–100.0)
Monocytes Absolute: 0.5 10*3/uL (ref 0.1–1.0)
Monocytes Relative: 8.8 % (ref 3.0–12.0)
Neutro Abs: 3 10*3/uL (ref 1.4–7.7)
Platelets: 251 10*3/uL (ref 150.0–400.0)
RDW: 12.7 % (ref 11.5–14.6)

## 2012-02-24 LAB — LIPID PANEL
Cholesterol: 213 mg/dL — ABNORMAL HIGH (ref 0–200)
Total CHOL/HDL Ratio: 6
Triglycerides: 185 mg/dL — ABNORMAL HIGH (ref 0.0–149.0)

## 2012-02-24 LAB — COMPREHENSIVE METABOLIC PANEL
Alkaline Phosphatase: 70 U/L (ref 39–117)
CO2: 29 mEq/L (ref 19–32)
Creatinine, Ser: 0.9 mg/dL (ref 0.4–1.2)
GFR: 87.56 mL/min (ref >60.00)
Glucose, Bld: 87 mg/dL (ref 70–99)
Sodium: 141 mEq/L (ref 135–145)
Total Bilirubin: 1 mg/dL (ref 0.3–1.2)

## 2012-02-24 MED ORDER — LUBIPROSTONE 24 MCG PO CAPS: 24.0000 ug | ORAL_CAPSULE | ORAL | Status: DC

## 2012-02-24 MED ORDER — LISINOPRIL-HYDROCHLOROTHIAZIDE 10-12.5 MG PO TABS: 1.0000 | ORAL_TABLET | Freq: Every day | ORAL | Status: DC

## 2012-02-24 MED ORDER — LEVOTHYROXINE SODIUM 112 MCG PO TABS: 112.0000 ug | ORAL_TABLET | Freq: Every day | ORAL | Status: DC

## 2012-02-24 NOTE — Progress Notes (Signed)
  Subjective:    Patient ID: [name], female    DOB: [birth year], 61 y.o.   MRN: [id number]  HPI 61 year old patient who is seen today to establish with this office. Medical problems include hypertension hypothyroidism chronic constipation. Today she is doing quite well without concerns or complaints. 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 No history of tobacco use. One son age 33 who lives at home    Review of Systems  Constitutional: Negative.   HENT: Negative for hearing loss, congestion, sore throat, rhinorrhea, dental problem, sinus pressure and tinnitus.   Eyes: Negative for pain, discharge and visual disturbance.  Respiratory: Negative for cough and shortness of breath.   Cardiovascular: Negative for chest pain, palpitations and leg swelling.  Gastrointestinal: Negative for nausea, vomiting, abdominal pain, diarrhea, constipation, blood in stool and abdominal distention.  Genitourinary: Negative for dysuria, urgency, frequency, hematuria, flank pain, vaginal bleeding, vaginal discharge, difficulty urinating, vaginal pain and pelvic pain.  Musculoskeletal: Negative for joint swelling, arthralgias and gait problem.  Skin: Negative for rash.  Neurological: Negative for dizziness, syncope, speech difficulty, weakness, numbness and headaches.  Hematological: Negative for adenopathy.  Psychiatric/Behavioral: Negative for behavioral problems, dysphoric mood and agitation. The patient is not nervous/anxious.        Objective:   Physical Exam  Constitutional: She is oriented to person, place, and time. She appears well-developed and well-nourished.  HENT:  Head: Normocephalic.  Right Ear: External ear normal.  Left Ear: External ear normal.  Mouth/Throat: Oropharynx is clear and moist.  Eyes: Conjunctivae and EOM are normal. Pupils are equal, round, and reactive to light.  Neck: Normal range of motion. Neck supple. No  thyromegaly present.  Cardiovascular: Normal rate, regular rhythm, normal heart sounds and intact distal pulses.   Pulmonary/Chest: Effort normal and breath sounds normal.  Abdominal: Soft. Bowel sounds are normal. She exhibits no mass. There is no tenderness.  Musculoskeletal: Normal range of motion.  Lymphadenopathy:    She has no cervical adenopathy.  Neurological: She is alert and oriented to person, place, and time.  Skin: Skin is warm and dry. No rash noted.  Psychiatric: She has a normal mood and affect. Her behavior is normal.          Assessment & Plan:   Preventive health examination. Will check updated lab Hypertension stable Hypothyroidism.  Followup 6 months

## 2012-02-24 NOTE — Patient Instructions (Signed)
Limit your sodium (Salt) intake    It is important that you exercise regularly, at least 20 minutes 3 to 4 times per week.  If you develop chest pain or shortness of breath seek  medical attention.  Return in 6 months for follow-up  Take a calcium supplement, plus 800-1200 units of vitamin D 

## 2012-02-25 LAB — LDL CHOLESTEROL, DIRECT: Direct LDL: 140.8 mg/dL

## 2012-04-05 ENCOUNTER — Encounter: Payer: Self-pay | Admitting: Gastroenterology

## 2012-07-24 ENCOUNTER — Telehealth: Payer: Self-pay | Admitting: Internal Medicine

## 2012-07-24 NOTE — Telephone Encounter (Signed)
Forwarded to advise

## 2012-07-24 NOTE — Telephone Encounter (Signed)
Pharmacist called and said that the manufacturer has changed for the levothyroxine (SYNTHROID, LEVOTHROID) 112 MCG tablet . This is for generic. Just wanted to make Dr Amador Cunas aware.

## 2012-10-13 ENCOUNTER — Ambulatory Visit (INDEPENDENT_AMBULATORY_CARE_PROVIDER_SITE_OTHER): Payer: 59 | Admitting: Internal Medicine

## 2012-10-13 ENCOUNTER — Encounter: Payer: Self-pay | Admitting: Internal Medicine

## 2012-10-13 VITALS — BP 120/70 | HR 84 | Temp 97.8°F | Resp 18 | Wt 188.0 lb

## 2012-10-13 DIAGNOSIS — E039 Hypothyroidism, unspecified: Secondary | ICD-10-CM

## 2012-10-13 DIAGNOSIS — I10 Essential (primary) hypertension: Secondary | ICD-10-CM

## 2012-10-13 MED ORDER — HYDROCODONE-ACETAMINOPHEN 5-325 MG PO TABS: 1.0000 | ORAL_TABLET | Freq: Four times a day (QID) | ORAL | Status: DC | PRN

## 2012-10-13 NOTE — Patient Instructions (Addendum)
Limit your sodium (Salt) intake  Please check your blood pressure on a regular basis.  If it is consistently greater than 150/90, please make an office appointment.    It is important that you exercise regularly, at least 20 minutes 3 to 4 times per week.  If you develop chest pain or shortness of breath seek  medical attention.  Return in 6 months for follow-up  

## 2012-10-13 NOTE — Progress Notes (Signed)
Subjective:    Patient ID: [name], female    DOB: 1950-11-27, 61 y.o.   MRN: [id number]  HPI  61 year old patient who is seen today for followup of hypertension. She's had some mild headaches recently but otherwise doing quite well. She has been compliant with her medication. Blood pressure today well controlled. She also has a history of hypothyroidism  Past Medical History  Diagnosis Date  . Hypothyroidism   . Tubulovillous adenoma(M8263/0) 06/2008    1cm flat ascending colon  . Internal hemorrhoids   . Melanosis coli   . Headache   . HTN (hypertension)     History   Social History  . Marital Status: Widowed    Spouse Name: N/A    Number of Children: 1  . Years of Education: N/A   Occupational History  . Customer service [employer]   Social History Main Topics  . Smoking status: Never Smoker   . Smokeless tobacco: Never Used  . Alcohol Use: No  . Drug Use: No  . Sexually Active: Yes   Other Topics Concern  . Not on file   Social History Narrative  . No narrative on file    Past Surgical History  Procedure Date  . Tubal ligation 1977  . Vaginal delivery   . Diagnostic mammogram   . Colonoscopy 06/04/08    moderate internal hemorrhoids/melanosis coli/tubulovillous adenoma 1cm flat  . Cataract extraction 2011    bilateral  . Colonoscopy 12/17/2011    Procedure: COLONOSCOPY;  Surgeon: Arlyce Harman, MD;  Location: [facility];  Service: Endoscopy;  Laterality: N/A;  12:30    Family History  Problem Relation Age of Onset  . Colon cancer Neg Hx   . Lung cancer Neg Hx   . Ovarian cancer Neg Hx   . Breast cancer Sister     two, one deceased  . Cancer Sister     breast ca  . Pancreatic cancer Mother     deceased, 97  . Heart disease Mother   . Hypertension Mother     No Known Allergies  Current Outpatient Prescriptions on File Prior to Visit  Medication Sig Dispense Refill  . levothyroxine (SYNTHROID, LEVOTHROID) 112 MCG tablet Take 1  tablet (112 mcg total) by mouth daily.  90 tablet  6  . lisinopril-hydrochlorothiazide (PRINZIDE,ZESTORETIC) 10-12.5 MG per tablet Take 1 tablet by mouth daily.  90 tablet  6  . lubiprostone (AMITIZA) 24 MCG capsule Take 1 capsule (24 mcg total) by mouth 2 (two) times a week.  90 capsule  6    BP 120/70  Pulse 84  Temp 97.8 F (36.6 C) (Oral)  Resp 18  Wt 188 lb (85.276 kg)  SpO2 97%       Review of Systems  Constitutional: Negative.   HENT: Negative for hearing loss, congestion, sore throat, rhinorrhea, dental problem, sinus pressure and tinnitus.   Eyes: Negative for pain, discharge and visual disturbance.  Respiratory: Negative for cough and shortness of breath.   Cardiovascular: Negative for chest pain, palpitations and leg swelling.  Gastrointestinal: Negative for nausea, vomiting, abdominal pain, diarrhea, constipation, blood in stool and abdominal distention.  Genitourinary: Negative for dysuria, urgency, frequency, hematuria, flank pain, vaginal bleeding, vaginal discharge, difficulty urinating, vaginal pain and pelvic pain.  Musculoskeletal: Negative for joint swelling, arthralgias and gait problem.  Skin: Negative for rash.  Neurological: Positive for headaches. Negative for dizziness, syncope, speech difficulty, weakness and numbness.  Hematological: Negative for adenopathy.  Psychiatric/Behavioral:  Negative for behavioral problems, dysphoric mood and agitation. The patient is not nervous/anxious.        Objective:   Physical Exam  Constitutional: She is oriented to person, place, and time. She appears well-developed and well-nourished.  HENT:  Head: Normocephalic.  Right Ear: External ear normal.  Left Ear: External ear normal.  Mouth/Throat: Oropharynx is clear and moist.  Eyes: Conjunctivae normal and EOM are normal. Pupils are equal, round, and reactive to light.  Neck: Normal range of motion. Neck supple. No thyromegaly present.  Cardiovascular: Normal  rate, regular rhythm, normal heart sounds and intact distal pulses.   Pulmonary/Chest: Effort normal and breath sounds normal.  Abdominal: Soft. Bowel sounds are normal. She exhibits no mass. There is no tenderness.  Musculoskeletal: Normal range of motion.  Lymphadenopathy:    She has no cervical adenopathy.  Neurological: She is alert and oriented to person, place, and time.  Skin: Skin is warm and dry. No rash noted.  Psychiatric: She has a normal mood and affect. Her behavior is normal.          Assessment & Plan:   Hypertension well controlled Mild headaches. We'll treat symptomatically will try Tylenol and/or ibuprofen hypothyroidism stable  CPX 6 months

## 2012-11-03 ENCOUNTER — Other Ambulatory Visit: Payer: Self-pay | Admitting: Internal Medicine

## 2012-11-03 DIAGNOSIS — Z1231 Encounter for screening mammogram for malignant neoplasm of breast: Secondary | ICD-10-CM

## 2012-11-09 ENCOUNTER — Ambulatory Visit (HOSPITAL_COMMUNITY)
Admission: RE | Admit: 2012-11-09 | Discharge: 2012-11-09 | Disposition: A | Discharge status: Home / Self Care | Payer: 59 | Source: Ambulatory Visit | Attending: Internal Medicine | Admitting: Internal Medicine

## 2012-11-09 DIAGNOSIS — Z1231 Encounter for screening mammogram for malignant neoplasm of breast: Secondary | ICD-10-CM | POA: Insufficient documentation

## 2012-11-29 ENCOUNTER — Emergency Department (HOSPITAL_COMMUNITY)
Admission: EM | Admit: 2012-11-29 | Discharge: 2012-11-29 | Disposition: A | Discharge status: Home / Self Care | Payer: 59 | Attending: Emergency Medicine | Admitting: Emergency Medicine

## 2012-11-29 ENCOUNTER — Encounter (HOSPITAL_COMMUNITY): Payer: Self-pay | Admitting: Emergency Medicine

## 2012-11-29 ENCOUNTER — Emergency Department (HOSPITAL_COMMUNITY): Payer: 59

## 2012-11-29 DIAGNOSIS — I10 Essential (primary) hypertension: Secondary | ICD-10-CM | POA: Insufficient documentation

## 2012-11-29 DIAGNOSIS — Z79899 Other long term (current) drug therapy: Secondary | ICD-10-CM | POA: Insufficient documentation

## 2012-11-29 DIAGNOSIS — E039 Hypothyroidism, unspecified: Secondary | ICD-10-CM | POA: Insufficient documentation

## 2012-11-29 DIAGNOSIS — S43499A Other sprain of unspecified shoulder joint, initial encounter: Secondary | ICD-10-CM | POA: Insufficient documentation

## 2012-11-29 DIAGNOSIS — R61 Generalized hyperhidrosis: Secondary | ICD-10-CM | POA: Insufficient documentation

## 2012-11-29 DIAGNOSIS — Y939 Activity, unspecified: Secondary | ICD-10-CM | POA: Insufficient documentation

## 2012-11-29 DIAGNOSIS — Y929 Unspecified place or not applicable: Secondary | ICD-10-CM | POA: Insufficient documentation

## 2012-11-29 DIAGNOSIS — X58XXXA Exposure to other specified factors, initial encounter: Secondary | ICD-10-CM | POA: Insufficient documentation

## 2012-11-29 DIAGNOSIS — R05 Cough: Secondary | ICD-10-CM | POA: Insufficient documentation

## 2012-11-29 DIAGNOSIS — Z8719 Personal history of other diseases of the digestive system: Secondary | ICD-10-CM | POA: Insufficient documentation

## 2012-11-29 DIAGNOSIS — S46819A Strain of other muscles, fascia and tendons at shoulder and upper arm level, unspecified arm, initial encounter: Secondary | ICD-10-CM

## 2012-11-29 DIAGNOSIS — Z8679 Personal history of other diseases of the circulatory system: Secondary | ICD-10-CM | POA: Insufficient documentation

## 2012-11-29 DIAGNOSIS — R059 Cough, unspecified: Secondary | ICD-10-CM | POA: Insufficient documentation

## 2012-11-29 LAB — COMPREHENSIVE METABOLIC PANEL
Albumin: 4.1 g/dL (ref 3.5–5.2)
Alkaline Phosphatase: 74 U/L (ref 39–117)
BUN: 15 mg/dL (ref 6–23)
CO2: 28 mEq/L (ref 19–32)
Chloride: 100 mEq/L (ref 96–112)
Creatinine, Ser: 0.98 mg/dL (ref 0.50–1.10)
GFR calc non Af Amer: 61 mL/min — ABNORMAL LOW (ref >90)
Glucose, Bld: 95 mg/dL (ref 70–99)
Potassium: 3.9 mEq/L (ref 3.5–5.1)
Total Bilirubin: 0.8 mg/dL (ref 0.3–1.2)

## 2012-11-29 LAB — CBC WITH DIFFERENTIAL/PLATELET
Basophils Relative: 1 % (ref 0–1)
HCT: 44.5 % (ref 36.0–46.0)
Hemoglobin: 15.1 g/dL — ABNORMAL HIGH (ref 12.0–15.0)
Lymphocytes Relative: 40 % (ref 12–46)
Lymphs Abs: 2.3 10*3/uL (ref 0.7–4.0)
Monocytes Absolute: 0.4 10*3/uL (ref 0.1–1.0)
Monocytes Relative: 7 % (ref 3–12)
Neutro Abs: 2.8 10*3/uL (ref 1.7–7.7)
Neutrophils Relative %: 49 % (ref 43–77)
RBC: 5.08 MIL/uL (ref 3.87–5.11)
WBC: 5.7 10*3/uL (ref 4.0–10.5)

## 2012-11-29 LAB — D-DIMER, QUANTITATIVE: D-Dimer, Quant: 2.65 ug/mL-FEU — ABNORMAL HIGH (ref 0.00–0.48)

## 2012-11-29 MED ORDER — CELECOXIB 200 MG PO CAPS: 200.0000 mg | ORAL_CAPSULE | Freq: Two times a day (BID) | ORAL | Status: DC

## 2012-11-29 MED ORDER — IOHEXOL 350 MG/ML SOLN
100.0000 mL | Freq: Once | INTRAVENOUS | Status: AC | PRN
  Administered 2012-11-29: 100 mL via INTRAVENOUS

## 2012-11-29 NOTE — ED Provider Notes (Signed)
History   This chart was scribed for Benny Lennert, MD by Sofie Rower, ED Scribe. The patient was seen in room [facility] and the patient's care was started at 3:13PM.    CSN: [id number]  Arrival date & time 11/29/12  1509   First MD Initiated Contact with Patient 11/29/12 1513      Chief Complaint  Patient presents with  . Chest Pain    (Consider location/radiation/quality/duration/timing/severity/associated sxs/prior treatment) Patient is a 62 y.o. female presenting with chest pain and shoulder pain. The history is provided by the patient. No language interpreter was used.  Chest Pain The chest pain began 3 - 5 days ago (4 days ago). Chest pain occurs intermittently. The chest pain is worsening. The pain is associated with stress and breathing. The severity of the pain is moderate. The quality of the pain is described as aching. The pain radiates to the left shoulder. Chest pain is worsened by stress and certain positions. Primary symptoms include cough. Pertinent negatives for primary symptoms include no fatigue and no abdominal pain.  Associated symptoms include diaphoresis. She tried nothing for the symptoms. Risk factors include stress.  Pertinent negatives for past medical history include no seizures.    Shoulder Pain This is a new problem. The current episode started more than 1 week ago (3 years ago). The problem occurs constantly. The problem has been gradually worsening. Associated symptoms include chest pain. Pertinent negatives include no abdominal pain and no headaches. The symptoms are aggravated by twisting and bending. Nothing relieves the symptoms. She has tried nothing for the symptoms. The treatment provided no relief.    The pt does not smoke nor drink alcohol.   PCP is Dr. Amador Cunas.  Past Medical History  Diagnosis Date  . Hypothyroidism   . Tubulovillous adenoma(M8263/0) 06/2008    1cm flat ascending colon  . Internal hemorrhoids   . Melanosis coli   .  Headache   . HTN (hypertension)     Past Surgical History  Procedure Date  . Tubal ligation 1977  . Vaginal delivery   . Diagnostic mammogram   . Colonoscopy 06/04/08    moderate internal hemorrhoids/melanosis coli/tubulovillous adenoma 1cm flat  . Cataract extraction 2011    bilateral  . Colonoscopy 12/17/2011    Procedure: COLONOSCOPY;  Surgeon: Arlyce Harman, MD;  Location: [facility];  Service: Endoscopy;  Laterality: N/A;  12:30    Family History  Problem Relation Age of Onset  . Colon cancer Neg Hx   . Lung cancer Neg Hx   . Ovarian cancer Neg Hx   . Breast cancer Sister     two, one deceased  . Cancer Sister     breast ca  . Pancreatic cancer Mother     deceased, 79  . Heart disease Mother   . Hypertension Mother     History  Substance Use Topics  . Smoking status: Never Smoker   . Smokeless tobacco: Never Used  . Alcohol Use: No    OB History    Grav Para Term Preterm Abortions TAB SAB Ect Mult Living                  Review of Systems  Constitutional: Positive for diaphoresis. Negative for fatigue.  HENT: Negative for congestion, sinus pressure and ear discharge.   Eyes: Negative for discharge.  Respiratory: Positive for cough.   Cardiovascular: Positive for chest pain.  Gastrointestinal: Negative for abdominal pain and diarrhea.  Genitourinary: Negative for  frequency and hematuria.  Musculoskeletal: Positive for arthralgias. Negative for back pain.  Skin: Negative for rash.  Neurological: Negative for seizures and headaches.  Hematological: Negative.   Psychiatric/Behavioral: Negative for hallucinations.    Allergies  Review of patient's allergies indicates no known allergies.  Home Medications   Current Outpatient Rx  Name  Route  Sig  Dispense  Refill  . HYDROCODONE-ACETAMINOPHEN 5-325 MG PO TABS   Oral   Take 1 tablet by mouth every 6 (six) hours as needed.   60 tablet   3   . LEVOTHYROXINE SODIUM 112 MCG PO TABS   Oral   Take 1  tablet (112 mcg total) by mouth daily.   90 tablet   6   . LISINOPRIL-HYDROCHLOROTHIAZIDE 10-12.5 MG PO TABS   Oral   Take 1 tablet by mouth daily.   90 tablet   6   . LUBIPROSTONE 24 MCG PO CAPS   Oral   Take 1 capsule (24 mcg total) by mouth 2 (two) times a week.   90 capsule   6     BP 123/76  Pulse 83  Temp 97.8 F (36.6 C) (Oral)  Resp 20  Ht 5' 7.5" (1.715 m)  Wt 190 lb (86.183 kg)  BMI 29.32 kg/m2  SpO2 96%  Physical Exam  Nursing note and vitals reviewed. Constitutional: She is oriented to person, place, and time. She appears well-developed.  HENT:  Head: Normocephalic and atraumatic.  Eyes: Conjunctivae normal and EOM are normal. No scleral icterus.  Neck: Neck supple. No thyromegaly present.  Cardiovascular: Normal rate and regular rhythm.  Exam reveals no gallop and no friction rub.   No murmur heard. Pulmonary/Chest: No stridor. She has no wheezes. She has no rales. She exhibits no tenderness.  Abdominal: She exhibits no distension. There is no tenderness. There is no rebound.  Musculoskeletal: Normal range of motion. She exhibits tenderness. She exhibits no edema.       Left shoulder: She exhibits tenderness.       Tenderness detected at the left trapezius muscle.   Lymphadenopathy:    She has no cervical adenopathy.  Neurological: She is oriented to person, place, and time. Coordination normal.  Skin: No rash noted. No erythema.  Psychiatric: She has a normal mood and affect. Her behavior is normal.    ED Course  Procedures (including critical care time)  DIAGNOSTIC STUDIES: Oxygen Saturation is 96% on room air, normal by my interpretation.    COORDINATION OF CARE:  3:30 PM- Treatment plan discussed with patient. Pt agrees with treatment.  6:52 PM- Recheck. Treatment plan concerning radiology and laboratory results, in addition to follow up with Dr. Amador Cunas discussed with patient. Pt agrees with treatment.        Results for orders  placed during the hospital encounter of 11/29/12  CBC WITH DIFFERENTIAL      Component Value Range   WBC 5.7  4.0 - 10.5 K/uL   RBC 5.08  3.87 - 5.11 MIL/uL   Hemoglobin 15.1 (*) 12.0 - 15.0 g/dL   HCT 52.8  41.3 - 24.4 %   MCV 87.6  78.0 - 100.0 fL   MCH 29.7  26.0 - 34.0 pg   MCHC 33.9  30.0 - 36.0 g/dL   RDW 01.0  27.2 - 53.6 %   Platelets 256  150 - 400 K/uL   Neutrophils Relative 49  43 - 77 %   Neutro Abs 2.8  1.7 - 7.7 K/uL  Lymphocytes Relative 40  12 - 46 %   Lymphs Abs 2.3  0.7 - 4.0 K/uL   Monocytes Relative 7  3 - 12 %   Monocytes Absolute 0.4  0.1 - 1.0 K/uL   Eosinophils Relative 3  0 - 5 %   Eosinophils Absolute 0.2  0.0 - 0.7 K/uL   Basophils Relative 1  0 - 1 %   Basophils Absolute 0.0  0.0 - 0.1 K/uL  COMPREHENSIVE METABOLIC PANEL      Component Value Range   Sodium 137  135 - 145 mEq/L   Potassium 3.9  3.5 - 5.1 mEq/L   Chloride 100  96 - 112 mEq/L   CO2 28  19 - 32 mEq/L   Glucose, Bld 95  70 - 99 mg/dL   BUN 15  6 - 23 mg/dL   Creatinine, Ser 5.28  0.50 - 1.10 mg/dL   Calcium 9.9  8.4 - 41.3 mg/dL   Total Protein 7.6  6.0 - 8.3 g/dL   Albumin 4.1  3.5 - 5.2 g/dL   AST 36  0 - 37 U/L   ALT 35  0 - 35 U/L   Alkaline Phosphatase 74  39 - 117 U/L   Total Bilirubin 0.8  0.3 - 1.2 mg/dL   GFR calc non Af Amer 61 (*) >90 mL/min   GFR calc Af Amer 71 (*) >90 mL/min  D-DIMER, QUANTITATIVE      Component Value Range   D-Dimer, Quant 2.65 (*) 0.00 - 0.48 ug/mL-FEU   Dg Chest 2 View  11/29/2012  *RADIOLOGY REPORT*  Clinical Data: Chest pain.  CHEST - 2 VIEW  Comparison: 09/14/2011 and 07/04/2009  Findings: Heart size and pulmonary vascularity are normal and the lungs are clear.  No significant osseous abnormality.  IMPRESSION: No acute disease.  No change since the prior exams.   Original Report Authenticated By: Francene Boyers, M.D.    Ct Angio Chest Pe W/cm &/or Wo Cm  11/29/2012  *RADIOLOGY REPORT*  Clinical Data: 62 year old female with chest pain.  CT  ANGIOGRAPHY CHEST  Technique:  Multidetector CT imaging of the chest using the standard protocol during bolus administration of intravenous contrast. Multiplanar reconstructed images including MIPs were obtained and reviewed to evaluate the vascular anatomy.  Contrast: OMNIPAQUE IOHEXOL 350 MG/ML SOLN  Comparison: 07/16/2009  Findings: This is a technically adequate study.  No pulmonary emboli are identified. There is no evidence of thoracic aortic aneurysm. Mild cardiomegaly is identified.  There is no evidence of pleural or pericardial effusion. No enlarged lymph nodes are identified.  A 4 x 7 mm nodule along the right major fissure and a 4 mm nodule along the right minor fissure (image 37) are unchanged and benign.  Mild dependent atelectasis is present.  There is no evidence of suspicious nodule, mass, airspace disease, consolidation, or endobronchial/endotracheal lesion. Minimal lingular scarring is again identified.  No acute or suspicious bony abnormalities are identified. The visualized upper abdomen is unremarkable.  IMPRESSION: No evidence of acute abnormality - no evidence of pulmonary emboli or thoracic aortic aneurysm.  Mild cardiomegaly.   Original Report Authenticated By: Harmon Pier, M.D.             No diagnosis found.    MDM       The chart was scribed for me under my direct supervision.  I personally performed the history, physical, and medical decision making and all procedures in the evaluation of this patient.Marland Kitchen  Benny Lennert, MD 11/29/12 [phone]

## 2012-11-29 NOTE — ED Notes (Signed)
Pt /co nagging pain to L shoulder blade x 3 years intermittant. Pt c/o chest throbbing to Lest side under L breast x 4 days intermittant. Cp worse with deep breath. Nondiaphoretic. nad

## 2012-12-28 ENCOUNTER — Ambulatory Visit: Payer: 59 | Admitting: Internal Medicine

## 2013-01-29 ENCOUNTER — Other Ambulatory Visit: Payer: Self-pay | Admitting: Internal Medicine

## 2013-02-12 IMAGING — MG MM DIGITAL SCREENING BILAT
4 series · 4 of 4 positions shown · non-contrast
Comparison: none

DG SCREEN MAMMOGRAM BILATERAL
Bilateral CC and MLO view(s) were taken.

DIGITAL SCREENING MAMMOGRAM WITH CAD:
There are scattered fibroglandular densities.  No masses or malignant type calcifications are 
identified.  Compared with prior studies.
Images were processed with CAD.

[R CC]
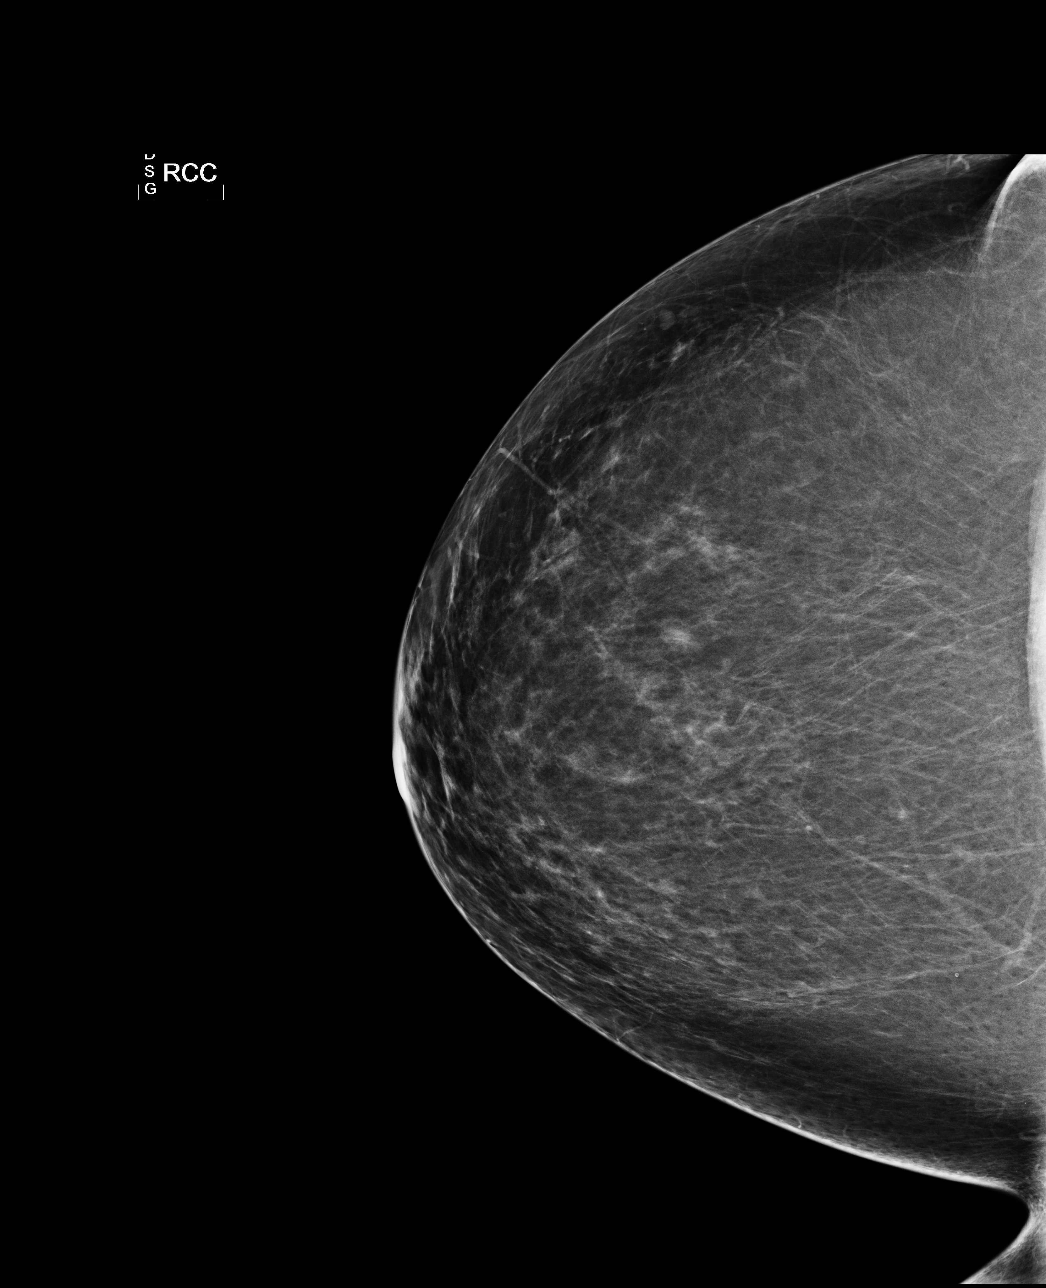

[R MLO]
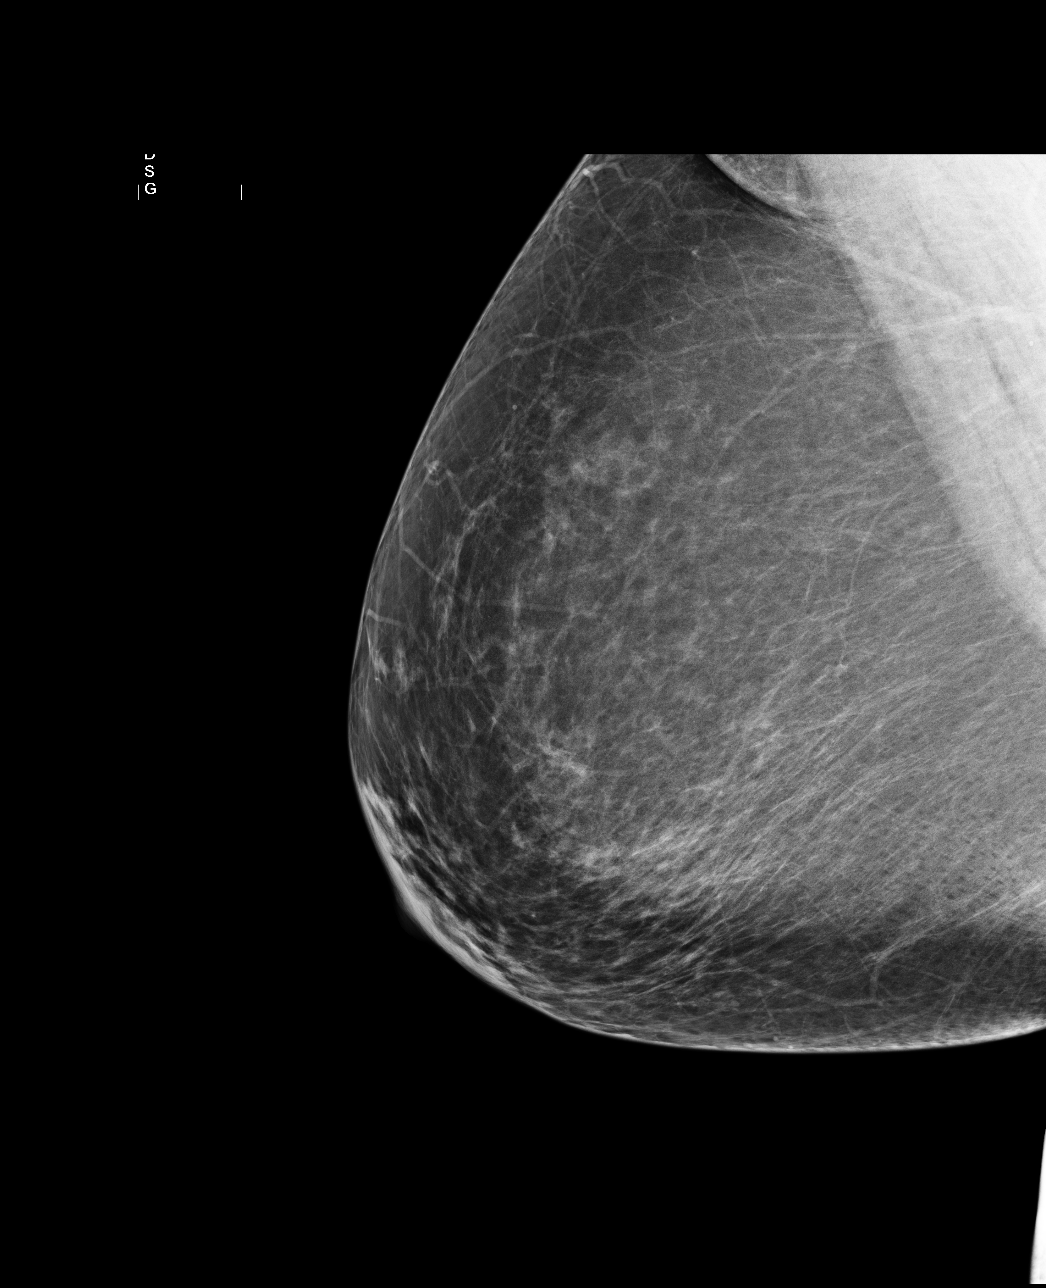

[L CC]
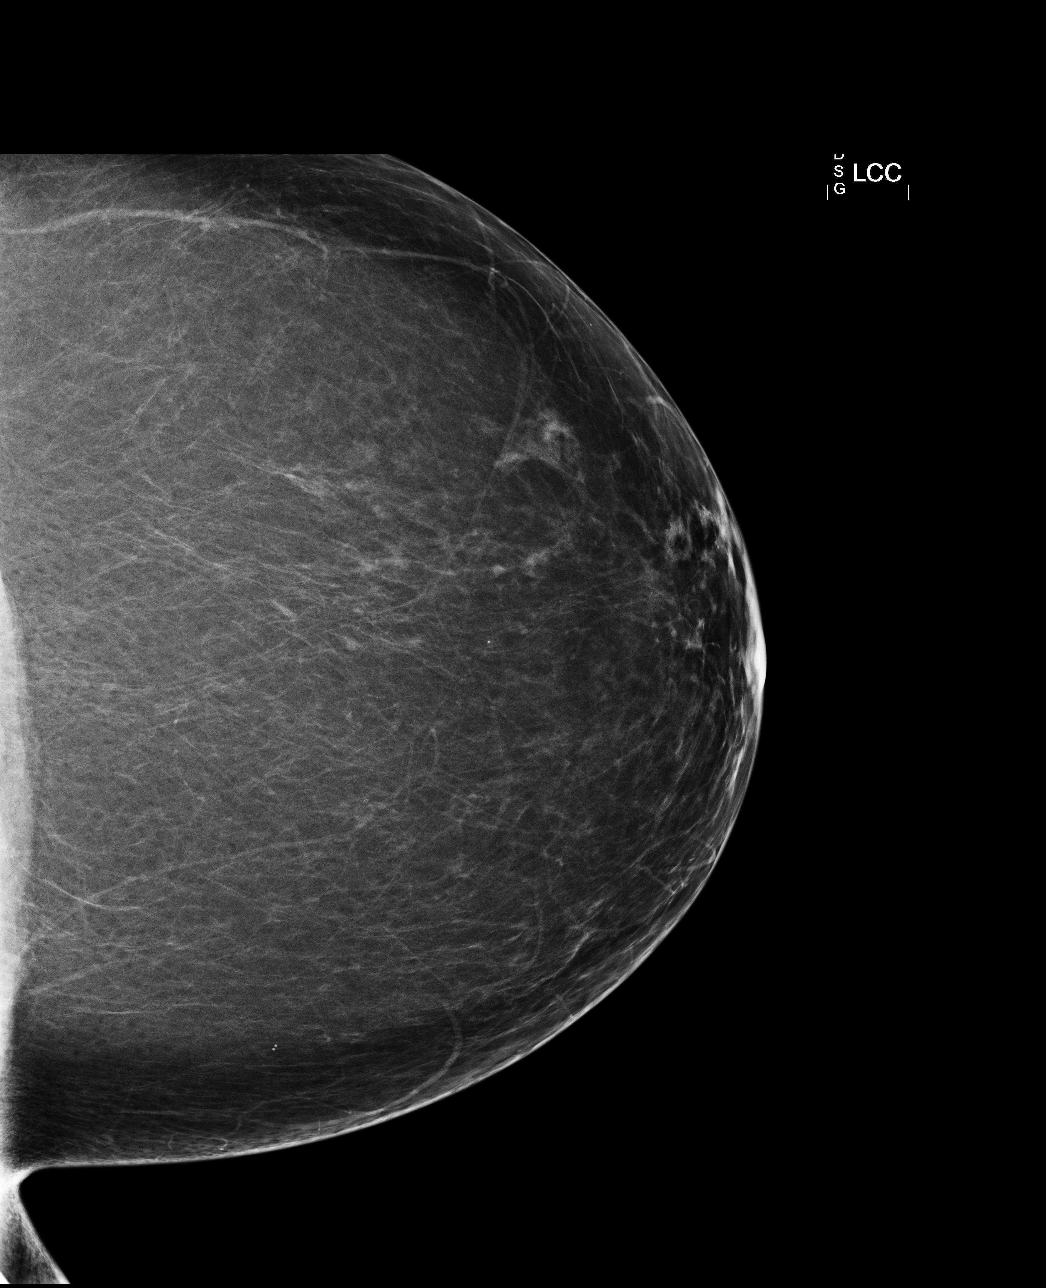

[L MLO]
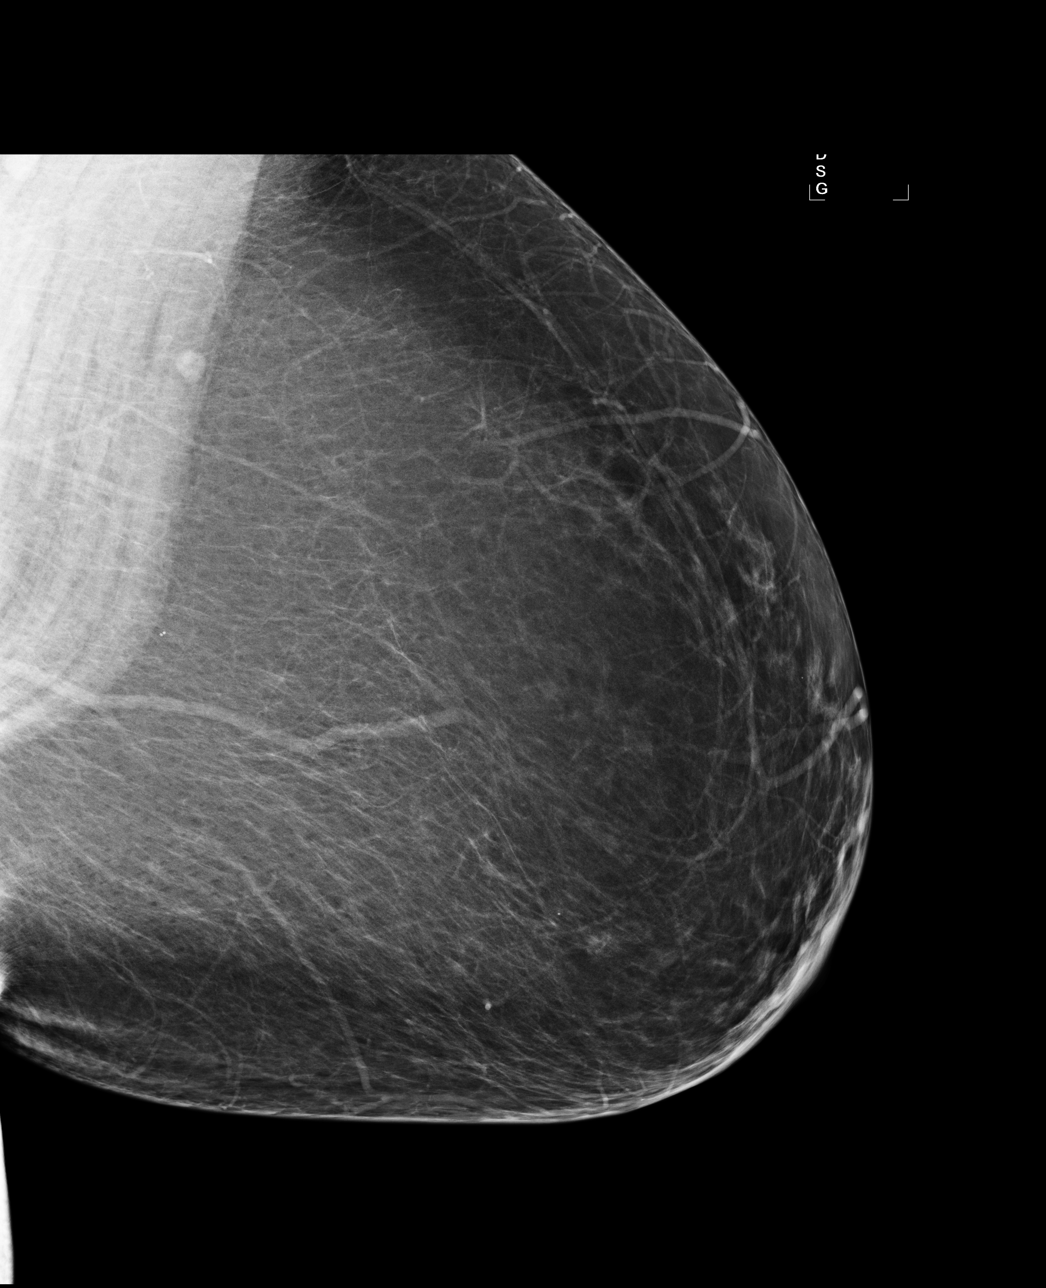

[4 of 4 positions shown; findings below may reference images not displayed]

IMPRESSION: No specific mammographic evidence of malignancy.  Next screening mammogram is recommended in one 
year.

A result letter of this screening mammogram will be mailed directly to the patient.

ASSESSMENT: Negative - BI-RADS 1

Screening mammogram in 1 year.
,

## 2013-05-03 ENCOUNTER — Other Ambulatory Visit: Payer: Self-pay | Admitting: Internal Medicine

## 2013-06-28 ENCOUNTER — Encounter: Payer: Self-pay | Admitting: Internal Medicine

## 2013-06-28 ENCOUNTER — Ambulatory Visit (INDEPENDENT_AMBULATORY_CARE_PROVIDER_SITE_OTHER): Payer: 59 | Admitting: Internal Medicine

## 2013-06-28 VITALS — BP 118/70 | HR 66 | Temp 98.1°F | Resp 20 | Wt 183.0 lb

## 2013-06-28 DIAGNOSIS — E039 Hypothyroidism, unspecified: Secondary | ICD-10-CM

## 2013-06-28 DIAGNOSIS — Z23 Encounter for immunization: Secondary | ICD-10-CM

## 2013-06-28 DIAGNOSIS — I10 Essential (primary) hypertension: Secondary | ICD-10-CM

## 2013-06-28 MED ORDER — HYDROCODONE-ACETAMINOPHEN 5-325 MG PO TABS: ORAL_TABLET | ORAL | Status: DC

## 2013-06-28 NOTE — Progress Notes (Signed)
Subjective:    Patient ID: [name], female    DOB: 09-09-1951, 62 y.o.   MRN: [id number]  HPI  this has done quite well on combination therapy. She continues to have some left-sided upper back pain just medial to the scapula. This flares from time to time. Otherwise done quite well. She has hypothyroidism as well as some chronic constipation issues. No new concerns or complaints.  Past Medical History  Diagnosis Date  . Hypothyroidism   . Tubulovillous adenoma 06/2008    1cm flat ascending colon  . Internal hemorrhoids   . Melanosis coli   . Headache(784.0)   . HTN (hypertension)     History   Social History  . Marital Status: Widowed    Spouse Name: N/A    Number of Children: 1  . Years of Education: N/A   Occupational History  . 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 Smoking status: Never Smoker   . Smokeless tobacco: Never Used  . Alcohol Use: No  . Drug Use: No  . Sexual Activity: Yes   Other Topics Concern  . Not on file   Social History Narrative  . No narrative on file    Past Surgical History  Procedure Laterality Date  . Tubal ligation  1977  . Vaginal delivery    . Diagnostic mammogram    . Colonoscopy  06/04/08    moderate internal hemorrhoids/melanosis coli/tubulovillous adenoma 1cm flat  . Cataract extraction  2011    bilateral  . Colonoscopy  12/17/2011    Procedure: COLONOSCOPY;  Surgeon: Arlyce Harman, MD;  Location: [facility];  Service: Endoscopy;  Laterality: N/A;  12:30    Family History  Problem Relation Age of Onset  . Colon cancer Neg Hx   . Lung cancer Neg Hx   . Ovarian cancer Neg Hx   . Breast cancer Sister     two, one deceased  . Cancer Sister     breast ca  . Pancreatic cancer Mother     deceased, 36  . Heart disease Mother   . Hypertension Mother     No Known Allergies  Current Outpatient Prescriptions on File Prior to Visit  Medication Sig Dispense Refill  . Calcium  Carbonate-Vitamin D (CALCIUM-VITAMIN D) 500-200 MG-UNIT per tablet Take 1 tablet by mouth every morning.      Marland Kitchen levothyroxine (SYNTHROID, LEVOTHROID) 112 MCG tablet TAKE 1 TABLET BY MOUTH DAILY.  90 tablet  3  . lisinopril-hydrochlorothiazide (PRINZIDE,ZESTORETIC) 10-12.5 MG per tablet TAKE 1 TABLET BY MOUTH DAILY.  90 tablet  1  . lubiprostone (AMITIZA) 24 MCG capsule Take 24 mcg by mouth 2 (two) times a week. *Takes only as needed*       No current facility-administered medications on file prior to visit.    BP 118/70  Pulse 66  Temp(Src) 98.1 F (36.7 C) (Oral)  Resp 20  Wt 183 lb (83.008 kg)  BMI 28.22 kg/m2  SpO2 99%       Review of Systems  Constitutional: Negative for fever, appetite change, fatigue and unexpected weight change.  HENT: Negative for hearing loss, ear pain, nosebleeds, congestion, sore throat, mouth sores, trouble swallowing, neck stiffness, dental problem, voice change, sinus pressure and tinnitus.   Eyes: Negative for photophobia, pain, redness and visual disturbance.  Respiratory: Negative for cough, chest tightness and shortness of breath.   Cardiovascular: Negative for chest pain, palpitations and leg swelling.  Gastrointestinal: Positive for  constipation. Negative for nausea, vomiting, abdominal pain, diarrhea, blood in stool, abdominal distention and rectal pain.  Genitourinary: Negative for dysuria, urgency, frequency, hematuria, flank pain, vaginal bleeding, vaginal discharge, difficulty urinating, genital sores, vaginal pain, menstrual problem and pelvic pain.  Musculoskeletal: Negative for back pain and arthralgias.  Skin: Negative for rash.  Neurological: Negative for dizziness, syncope, speech difficulty, weakness, light-headedness, numbness and headaches.  Hematological: Negative for adenopathy. Does not bruise/bleed easily.  Psychiatric/Behavioral: Negative for suicidal ideas, behavioral problems, self-injury, dysphoric mood and agitation. The  patient is not nervous/anxious.        Objective:   Physical Exam  Constitutional: She is oriented to person, place, and time. She appears well-developed and well-nourished.  HENT:  Head: Normocephalic.  Right Ear: External ear normal.  Left Ear: External ear normal.  Mouth/Throat: Oropharynx is clear and moist.  Eyes: Conjunctivae and EOM are normal. Pupils are equal, round, and reactive to light.  Neck: Normal range of motion. Neck supple. No thyromegaly present.  Cardiovascular: Normal rate, regular rhythm, normal heart sounds and intact distal pulses.   Pulmonary/Chest: Effort normal and breath sounds normal.  Abdominal: Soft. Bowel sounds are normal. She exhibits no mass. There is no tenderness.  Musculoskeletal: Normal range of motion.  Lymphadenopathy:    She has no cervical adenopathy.  Neurological: She is alert and oriented to person, place, and time.  Skin: Skin is warm and dry. No rash noted.  Psychiatric: She has a normal mood and affect. Her behavior is normal.          Assessment & Plan:   Hypertension. Well controlled will continue present regimen. Home blood pressure monitoring encouraged Chronic constipation. We'll continue Amitiza  as needed Hypothyroidism. We'll see in 6 months for annual exam and check a lipid profile and TSH at that time

## 2013-06-28 NOTE — Patient Instructions (Signed)
Limit your sodium (Salt) intake    It is important that you exercise regularly, at least 20 minutes 3 to 4 times per week.  If you develop chest pain or shortness of breath seek  medical attention.  Please check your blood pressure on a regular basis.  If it is consistently greater than 150/90, please make an office appointment.  Return in 6 months for follow-up   

## 2013-11-14 ENCOUNTER — Other Ambulatory Visit: Payer: Self-pay | Admitting: Internal Medicine

## 2013-11-15 NOTE — Telephone Encounter (Signed)
Pt is out °

## 2013-12-28 ENCOUNTER — Other Ambulatory Visit: Payer: Self-pay | Admitting: Internal Medicine

## 2013-12-28 DIAGNOSIS — Z1231 Encounter for screening mammogram for malignant neoplasm of breast: Secondary | ICD-10-CM

## 2014-01-01 ENCOUNTER — Ambulatory Visit (HOSPITAL_COMMUNITY)
Admission: RE | Admit: 2014-01-01 | Discharge: 2014-01-01 | Disposition: A | Discharge status: Home / Self Care | Payer: 59 | Source: Ambulatory Visit | Attending: Internal Medicine | Admitting: Internal Medicine

## 2014-01-01 DIAGNOSIS — Z1231 Encounter for screening mammogram for malignant neoplasm of breast: Secondary | ICD-10-CM | POA: Insufficient documentation

## 2014-01-03 ENCOUNTER — Other Ambulatory Visit: Payer: Self-pay | Admitting: Internal Medicine

## 2014-01-03 DIAGNOSIS — R928 Other abnormal and inconclusive findings on diagnostic imaging of breast: Secondary | ICD-10-CM

## 2014-01-10 ENCOUNTER — Other Ambulatory Visit: Payer: Self-pay

## 2014-01-10 ENCOUNTER — Other Ambulatory Visit: Payer: Self-pay | Admitting: Internal Medicine

## 2014-01-10 DIAGNOSIS — R928 Other abnormal and inconclusive findings on diagnostic imaging of breast: Secondary | ICD-10-CM

## 2014-01-16 ENCOUNTER — Ambulatory Visit
Admission: RE | Admit: 2014-01-16 | Discharge: 2014-01-16 | Disposition: A | Discharge status: Home / Self Care | Payer: 59 | Source: Ambulatory Visit | Attending: Internal Medicine | Admitting: Internal Medicine

## 2014-01-16 DIAGNOSIS — R928 Other abnormal and inconclusive findings on diagnostic imaging of breast: Secondary | ICD-10-CM

## 2014-02-11 ENCOUNTER — Ambulatory Visit (INDEPENDENT_AMBULATORY_CARE_PROVIDER_SITE_OTHER): Payer: 59 | Admitting: Internal Medicine

## 2014-02-11 ENCOUNTER — Encounter: Payer: Self-pay | Admitting: Internal Medicine

## 2014-02-11 ENCOUNTER — Other Ambulatory Visit: Payer: Self-pay | Admitting: Internal Medicine

## 2014-02-11 VITALS — BP 110/66 | HR 61 | Temp 97.8°F | Resp 20 | Ht 67.5 in | Wt 193.0 lb

## 2014-02-11 DIAGNOSIS — E039 Hypothyroidism, unspecified: Secondary | ICD-10-CM

## 2014-02-11 DIAGNOSIS — I10 Essential (primary) hypertension: Secondary | ICD-10-CM

## 2014-02-11 DIAGNOSIS — M722 Plantar fascial fibromatosis: Secondary | ICD-10-CM

## 2014-02-11 NOTE — Progress Notes (Signed)
Subjective:    Patient ID: [name], female    DOB: October 28, 1951, 63 y.o.   MRN: [id number]  HPI  63 year old patient who is seen today in followup.  She has a history of hypertension and hypothyroidism.  She also has a history of chronic constipation.  For the past 4 months.  She has had some left heel pain.  She also describes some low back pain.  Past Medical History  Diagnosis Date  . Hypothyroidism   . Tubulovillous adenoma 06/2008    1cm flat ascending colon  . Internal hemorrhoids   . Melanosis coli   . Headache(784.0)   . HTN (hypertension)     History   Social History  . Marital Status: Widowed    Spouse Name: N/A    Number of Children: 1  . Years of Education: N/A   Occupational History  . 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 Smoking status: Never Smoker   . Smokeless tobacco: Never Used  . Alcohol Use: No  . Drug Use: No  . Sexual Activity: Yes   Other Topics Concern  . Not on file   Social History Narrative  . No narrative on file    Past Surgical History  Procedure Laterality Date  . Tubal ligation  1977  . Vaginal delivery    . Diagnostic mammogram    . Colonoscopy  06/04/08    moderate internal hemorrhoids/melanosis coli/tubulovillous adenoma 1cm flat  . Cataract extraction  2011    bilateral  . Colonoscopy  12/17/2011    Procedure: COLONOSCOPY;  Surgeon: Dorothyann Peng, MD;  Location: [facility];  Service: Endoscopy;  Laterality: N/A;  12:30    Family History  Problem Relation Age of Onset  . Colon cancer Neg Hx   . Lung cancer Neg Hx   . Ovarian cancer Neg Hx   . Breast cancer Sister     two, one deceased  . Cancer Sister     breast ca  . Pancreatic cancer Mother     deceased, 32  . Heart disease Mother   . Hypertension Mother     No Known Allergies  Current Outpatient Prescriptions on File Prior to Visit  Medication Sig Dispense Refill  . Calcium Carbonate-Vitamin D (CALCIUM-VITAMIN D) 500-200  MG-UNIT per tablet Take 1 tablet by mouth every morning.      Marland Kitchen HYDROcodone-acetaminophen (NORCO/VICODIN) 5-325 MG per tablet TAKE 1 TABLET BY MOUTH EVERY 6 HOURS AS NEEDED  60 tablet  2  . levothyroxine (SYNTHROID, LEVOTHROID) 112 MCG tablet TAKE 1 TABLET BY MOUTH DAILY.  90 tablet  3  . lisinopril-hydrochlorothiazide (PRINZIDE,ZESTORETIC) 10-12.5 MG per tablet TAKE 1 TABLET BY MOUTH DAILY.  90 tablet  1  . lubiprostone (AMITIZA) 24 MCG capsule Take 24 mcg by mouth 2 (two) times a week. *Takes only as needed*       No current facility-administered medications on file prior to visit.    BP 110/66  Pulse 61  Temp(Src) 97.8 F (36.6 C) (Oral)  Resp 20  Ht 5' 7.5" (1.715 m)  Wt 193 lb (87.544 kg)  BMI 29.76 kg/m2  SpO2 98%       Review of Systems  Constitutional: Negative.   HENT: Negative for congestion, dental problem, hearing loss, rhinorrhea, sinus pressure, sore throat and tinnitus.   Eyes: Negative for pain, discharge and visual disturbance.  Respiratory: Negative for cough and shortness of breath.   Cardiovascular: Negative for  chest pain, palpitations and leg swelling.  Gastrointestinal: Negative for nausea, vomiting, abdominal pain, diarrhea, constipation, blood in stool and abdominal distention.  Genitourinary: Negative for dysuria, urgency, frequency, hematuria, flank pain, vaginal bleeding, vaginal discharge, difficulty urinating, vaginal pain and pelvic pain.  Musculoskeletal: Positive for back pain and gait problem. Negative for arthralgias (Left heel pain) and joint swelling.  Skin: Negative for rash.  Neurological: Negative for dizziness, syncope, speech difficulty, weakness, numbness and headaches.  Hematological: Negative for adenopathy.  Psychiatric/Behavioral: Negative for behavioral problems, dysphoric mood and agitation. The patient is not nervous/anxious.        Objective:   Physical Exam  Constitutional: She is oriented to person, place, and time. She  appears well-developed and well-nourished.  HENT:  Head: Normocephalic.  Right Ear: External ear normal.  Left Ear: External ear normal.  Mouth/Throat: Oropharynx is clear and moist.  Eyes: Conjunctivae and EOM are normal. Pupils are equal, round, and reactive to light.  Neck: Normal range of motion. Neck supple. No thyromegaly present.  Cardiovascular: Normal rate, regular rhythm, normal heart sounds and intact distal pulses.   Pulmonary/Chest: Effort normal and breath sounds normal.  Abdominal: Soft. Bowel sounds are normal. She exhibits no mass. There is no tenderness.  Musculoskeletal: Normal range of motion.  Lymphadenopathy:    She has no cervical adenopathy.  Neurological: She is alert and oriented to person, place, and time.  Skin: Skin is warm and dry. No rash noted.  Psychiatric: She has a normal mood and affect. Her behavior is normal.          Assessment & Plan:   Hypertension.  Well controlled.  Medications updated.  Hypothyroidism. Left heel pain.  Probable plantar fasciitis.  Information and is sports rehabilitation information dispensed  Schedule CPX

## 2014-02-11 NOTE — Progress Notes (Signed)
Pre-visit discussion using our clinic review tool. No additional management support is needed unless otherwise documented below in the visit note.  

## 2014-02-11 NOTE — Patient Instructions (Addendum)
Limit your sodium (Salt) intake    It is important that you exercise regularly, at least 20 minutes 3 to 4 times per week.  If you develop chest pain or shortness of breath seek  medical attention.  Please check your blood pressure on a regular basis.  If it is consistently greater than 150/90, please make an office appointment.  Return in 4 months for follow-up Plantar Fasciitis Plantar fasciitis is a common condition that causes foot pain. It is soreness (inflammation) of the band of tough fibrous tissue on the bottom of the foot that runs from the heel bone (calcaneus) to the ball of the foot. The cause of this soreness may be from excessive standing, poor fitting shoes, running on hard surfaces, being overweight, having an abnormal walk, or overuse (this is common in runners) of the painful foot or feet. It is also common in aerobic exercise dancers and ballet dancers. SYMPTOMS  Most people with plantar fasciitis complain of:  Severe pain in the morning on the bottom of their foot especially when taking the first steps out of bed. This pain recedes after a few minutes of walking.  Severe pain is experienced also during walking following a long period of inactivity.  Pain is worse when walking barefoot or up stairs DIAGNOSIS   Your caregiver will diagnose this condition by examining and feeling your foot.  Special tests such as X-rays of your foot, are usually not needed. PREVENTION   Consult a sports medicine professional before beginning a new exercise program.  Walking programs offer a good workout. With walking there is a lower chance of overuse injuries common to runners. There is less impact and less jarring of the joints.  Begin all new exercise programs slowly. If problems or pain develop, decrease the amount of time or distance until you are at a comfortable level.  Wear good shoes and replace them regularly.  Stretch your foot and the heel cords at the back of the ankle  (Achilles tendon) both before and after exercise.  Run or exercise on even surfaces that are not hard. For example, asphalt is better than pavement.  Do not run barefoot on hard surfaces.  If using a treadmill, vary the incline.  Do not continue to workout if you have foot or joint problems. Seek professional help if they do not improve. HOME CARE INSTRUCTIONS   Avoid activities that cause you pain until you recover.  Use ice or cold packs on the problem or painful areas after working out.  Only take over-the-counter or prescription medicines for pain, discomfort, or fever as directed by your caregiver.  Soft shoe inserts or athletic shoes with air or gel sole cushions may be helpful.  If problems continue or become more severe, consult a sports medicine caregiver or your own health care provider. Cortisone is a potent anti-inflammatory medication that may be injected into the painful area. You can discuss this treatment with your caregiver. MAKE SURE YOU:   Understand these instructions.  Will watch your condition.  Will get help right away if you are not doing well or get worse. Document Released: 07/13/2001 Document Revised: 01/10/2012 Document Reviewed: 09/11/2008 [facility] Patient Information 2014 [place], [place]. Plantar Fasciitis (Heel Spur Syndrome) with Rehab The plantar fascia is a fibrous, ligament-like, soft-tissue structure that spans the bottom of the foot. Plantar fasciitis is a condition that causes pain in the foot due to inflammation of the tissue. SYMPTOMS   Pain and tenderness on the underneath side  of the foot.  Pain that worsens with standing or walking. CAUSES  Plantar fasciitis is caused by irritation and injury to the plantar fascia on the underneath side of the foot. Common mechanisms of injury include:  Direct trauma to bottom of the foot.  Damage to a small nerve that runs under the foot where the main fascia attaches to the heel bone.  Stress  placed on the plantar fascia due to bone spurs. RISK INCREASES WITH:   Activities that place stress on the plantar fascia (running, jumping, pivoting, or cutting).  Poor strength and flexibility.  Improperly fitted shoes.  Tight calf muscles.  Flat feet.  Failure to warm-up properly before activity.  Obesity. PREVENTION  Warm up and stretch properly before activity.  Allow for adequate recovery between workouts.  Maintain physical fitness:  Strength, flexibility, and endurance.  Cardiovascular fitness.  Maintain a health body weight.  Avoid stress on the plantar fascia.  Wear properly fitted shoes, including arch supports for individuals who have flat feet. PROGNOSIS  If treated properly, then the symptoms of plantar fasciitis usually resolve without surgery. However, occasionally surgery is necessary. RELATED COMPLICATIONS   Recurrent symptoms that may result in a chronic condition.  Problems of the lower back that are caused by compensating for the injury, such as limping.  Pain or weakness of the foot during push-off following surgery.  Chronic inflammation, scarring, and partial or complete fascia tear, occurring more often from repeated injections. TREATMENT  Treatment initially involves the use of ice and medication to help reduce pain and inflammation. The use of strengthening and stretching exercises may help reduce pain with activity, especially stretches of the Achilles tendon. These exercises may be performed at home or with a therapist. Your caregiver may recommend that you use heel cups of arch supports to help reduce stress on the plantar fascia. Occasionally, corticosteroid injections are given to reduce inflammation. If symptoms persist for greater than 6 months despite non-surgical (conservative), then surgery may be recommended.  MEDICATION   If pain medication is necessary, then nonsteroidal anti-inflammatory medications, such as aspirin and  ibuprofen, or other minor pain relievers, such as acetaminophen, are often recommended.  Do not take pain medication within 7 days before surgery.  Prescription pain relievers may be given if deemed necessary by your caregiver. Use only as directed and only as much as you need.  Corticosteroid injections may be given by your caregiver. These injections should be reserved for the most serious cases, because they may only be given a certain number of times. HEAT AND COLD  Cold treatment (icing) relieves pain and reduces inflammation. Cold treatment should be applied for 10 to 15 minutes every 2 to 3 hours for inflammation and pain and immediately after any activity that aggravates your symptoms. Use ice packs or massage the area with a piece of ice (ice massage).  Heat treatment may be used prior to performing the stretching and strengthening activities prescribed by your caregiver, physical therapist, or athletic trainer. Use a heat pack or soak the injury in warm water. SEEK IMMEDIATE MEDICAL CARE IF:  Treatment seems to offer no benefit, or the condition worsens.  Any medications produce adverse side effects. EXERCISES RANGE OF MOTION (ROM) AND STRETCHING EXERCISES - Plantar Fasciitis (Heel Spur Syndrome) These exercises may help you when beginning to rehabilitate your injury. Your symptoms may resolve with or without further involvement from your physician, physical therapist or athletic trainer. While completing these exercises, remember:   Restoring tissue  flexibility helps normal motion to return to the joints. This allows healthier, less painful movement and activity.  An effective stretch should be held for at least 30 seconds.  A stretch should never be painful. You should only feel a gentle lengthening or release in the stretched tissue. RANGE OF MOTION - Toe Extension, Flexion  Sit with your right / left leg crossed over your opposite knee.  Grasp your toes and gently pull  them back toward the top of your foot. You should feel a stretch on the bottom of your toes and/or foot.  Hold this stretch for __________ seconds.  Now, gently pull your toes toward the bottom of your foot. You should feel a stretch on the top of your toes and or foot.  Hold this stretch for __________ seconds. Repeat __________ times. Complete this stretch __________ times per day.  RANGE OF MOTION - Ankle Dorsiflexion, Active Assisted  Remove shoes and sit on a chair that is preferably not on a carpeted surface.  Place right / left foot under knee. Extend your opposite leg for support.  Keeping your heel down, slide your right / left foot back toward the chair until you feel a stretch at your ankle or calf. If you do not feel a stretch, slide your bottom forward to the edge of the chair, while still keeping your heel down.  Hold this stretch for __________ seconds. Repeat __________ times. Complete this stretch __________ times per day.  STRETCH  Gastroc, Standing  Place hands on wall.  Extend right / left leg, keeping the front knee somewhat bent.  Slightly point your toes inward on your back foot.  Keeping your right / left heel on the floor and your knee straight, shift your weight toward the wall, not allowing your back to arch.  You should feel a gentle stretch in the right / left calf. Hold this position for __________ seconds. Repeat __________ times. Complete this stretch __________ times per day. STRETCH  Soleus, Standing  Place hands on wall.  Extend right / left leg, keeping the other knee somewhat bent.  Slightly point your toes inward on your back foot.  Keep your right / left heel on the floor, bend your back knee, and slightly shift your weight over the back leg so that you feel a gentle stretch deep in your back calf.  Hold this position for __________ seconds. Repeat __________ times. Complete this stretch __________ times per day. STRETCH  Gastrocsoleus,  Standing  Note: This exercise can place a lot of stress on your foot and ankle. Please complete this exercise only if specifically instructed by your caregiver.   Place the ball of your right / left foot on a step, keeping your other foot firmly on the same step.  Hold on to the wall or a rail for balance.  Slowly lift your other foot, allowing your body weight to press your heel down over the edge of the step.  You should feel a stretch in your right / left calf.  Hold this position for __________ seconds.  Repeat this exercise with a slight bend in your right / left knee. Repeat __________ times. Complete this stretch __________ times per day.  STRENGTHENING EXERCISES - Plantar Fasciitis (Heel Spur Syndrome)  These exercises may help you when beginning to rehabilitate your injury. They may resolve your symptoms with or without further involvement from your physician, physical therapist or athletic trainer. While completing these exercises, remember:   Muscles can gain  both the endurance and the strength needed for everyday activities through controlled exercises.  Complete these exercises as instructed by your physician, physical therapist or athletic trainer. Progress the resistance and repetitions only as guided. STRENGTH - Towel Curls  Sit in a chair positioned on a non-carpeted surface.  Place your foot on a towel, keeping your heel on the floor.  Pull the towel toward your heel by only curling your toes. Keep your heel on the floor.  If instructed by your physician, physical therapist or athletic trainer, add ____________________ at the end of the towel. Repeat __________ times. Complete this exercise __________ times per day. STRENGTH - Ankle Inversion  Secure one end of a rubber exercise band/tubing to a fixed object (table, pole). Loop the other end around your foot just before your toes.  Place your fists between your knees. This will focus your strengthening at your  ankle.  Slowly, pull your big toe up and in, making sure the band/tubing is positioned to resist the entire motion.  Hold this position for __________ seconds.  Have your muscles resist the band/tubing as it slowly pulls your foot back to the starting position. Repeat __________ times. Complete this exercises __________ times per day.  Document Released: 10/18/2005 Document Revised: 01/10/2012 Document Reviewed: 01/30/2009 [facility] Patient Information 2014 [place], [place].

## 2014-02-12 ENCOUNTER — Telehealth: Payer: Self-pay | Admitting: Internal Medicine

## 2014-02-12 NOTE — Telephone Encounter (Signed)
Relevant patient education assigned to patient using Emmi. ° °

## 2014-02-14 ENCOUNTER — Other Ambulatory Visit: Payer: Self-pay | Admitting: Internal Medicine

## 2014-05-16 ENCOUNTER — Other Ambulatory Visit: Payer: Self-pay | Admitting: Internal Medicine

## 2014-10-04 ENCOUNTER — Ambulatory Visit (INDEPENDENT_AMBULATORY_CARE_PROVIDER_SITE_OTHER): Payer: 59 | Admitting: Family Medicine

## 2014-10-04 ENCOUNTER — Encounter: Payer: Self-pay | Admitting: Family Medicine

## 2014-10-04 VITALS — BP 112/78 | HR 74 | Temp 97.7°F | Ht 67.5 in | Wt 186.1 lb

## 2014-10-04 DIAGNOSIS — J069 Acute upper respiratory infection, unspecified: Secondary | ICD-10-CM

## 2014-10-04 NOTE — Progress Notes (Signed)
Pre visit review using our clinic review tool, if applicable. No additional management support is needed unless otherwise documented below in the visit note. 

## 2014-10-04 NOTE — Patient Instructions (Signed)
INSTRUCTIONS FOR UPPER RESPIRATORY INFECTION:  -plenty of rest and fluids  -nasal saline wash 2-3 times daily (use prepackaged nasal saline or bottled/distilled water if making your own)   -clean nose with nasal saline before using the nasal steroid or sinex  -can use AFRIN nasal spray for drainage and nasal congestion - but do NOT use longer then 3-4 days  -can use tylenol or ibuprofen as directed for aches and sorethroat  -in the winter time, using a humidifier at night is helpful (please follow cleaning instructions)  -if you are taking a cough medication - use only as directed, may also try a teaspoon of honey to coat the throat and throat lozenges  -for sore throat, salt water gargles can help  -follow up if you have fevers, facial pain, tooth pain, difficulty breathing or are worsening or not getting better in 5-7 days  

## 2014-10-04 NOTE — Progress Notes (Signed)
HPI:  -started:4 days ago -symptoms:nasal congestion, sore throat, cough -denies:fever, SOB, NVD, tooth pain -has tried: hot tea and lemon -sick contacts/travel/risks: denies flu exposure, tick exposure or or Ebola risks  ROS: See pertinent positives and negatives per HPI.  Past Medical History  Diagnosis Date  . Hypothyroidism   . Tubulovillous adenoma 06/2008    1cm flat ascending colon  . Internal hemorrhoids   . Melanosis coli   . Headache(784.0)   . HTN (hypertension)     Past Surgical History  Procedure Laterality Date  . Tubal ligation  1977  . Vaginal delivery    . Diagnostic mammogram    . Colonoscopy  06/04/08    moderate internal hemorrhoids/melanosis coli/tubulovillous adenoma 1cm flat  . Cataract extraction  2011    bilateral  . Colonoscopy  12/17/2011    Procedure: COLONOSCOPY;  Surgeon: Dorothyann Peng, MD;  Location: [facility];  Service: Endoscopy;  Laterality: N/A;  12:30    Family History  Problem Relation Age of Onset  . Colon cancer Neg Hx   . Lung cancer Neg Hx   . Ovarian cancer Neg Hx   . Breast cancer Sister     two, one deceased  . Cancer Sister     breast ca  . Pancreatic cancer Mother     deceased, 45  . Heart disease Mother   . Hypertension Mother     History   Social History  . Marital Status: Widowed    Spouse Name: N/A    Number of Children: 1  . Years of Education: N/A   Occupational History  . 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 Smoking status: Never Smoker   . Smokeless tobacco: Never Used  . Alcohol Use: No  . Drug Use: No  . Sexual Activity: Yes   Other Topics Concern  . None   Social History Narrative    Current outpatient prescriptions: levothyroxine (SYNTHROID, LEVOTHROID) 112 MCG tablet, TAKE 1 TABLET BY MOUTH ONCE DAILY, Disp: 90 tablet, Rfl: 3;  lisinopril-hydrochlorothiazide (PRINZIDE,ZESTORETIC) 10-12.5 MG per tablet, TAKE 1 TABLET BY MOUTH ONCE DAILY, Disp: 90 tablet, Rfl:  1  EXAM:  Filed Vitals:   10/04/14 1257  BP: 112/78  Pulse: 74  Temp: 97.7 F (36.5 C)    Body mass index is 28.7 kg/(m^2).  GENERAL: vitals reviewed and listed above, alert, oriented, appears well hydrated and in no acute distress  HEENT: atraumatic, conjunttiva clear, no obvious abnormalities on inspection of external nose and ears, normal appearance of ear canals and TMs, clear nasal congestion, mild post oropharyngeal erythema with PND, no tonsillar edema or exudate, no sinus TTP  NECK: no obvious masses on inspection  LUNGS: clear to auscultation bilaterally, no wheezes, rales or rhonchi, good air movement  CV: HRRR, no peripheral edema  MS: moves all extremities without noticeable abnormality  PSYCH: pleasant and cooperative, no obvious depression or anxiety  ASSESSMENT AND PLAN:  Discussed the following assessment and plan:  Acute upper respiratory infection  -given HPI and exam findings today, a serious infection or illness is unlikely. We discussed potential etiologies, with VURI being most likely, and advised supportive care and monitoring. We discussed treatment side effects, likely course, antibiotic misuse, transmission, and signs of developing a serious illness. -of course, we advised to return or notify a doctor immediately if symptoms worsen or persist or new concerns arise.    Patient Instructions  INSTRUCTIONS FOR UPPER RESPIRATORY INFECTION:  -  plenty of rest and fluids  -nasal saline wash 2-3 times daily (use prepackaged nasal saline or bottled/distilled water if making your own)   -clean nose with nasal saline before using the nasal steroid or sinex  -can use AFRIN nasal spray for drainage and nasal congestion - but do NOT use longer then 3-4 days  -can use tylenol or ibuprofen as directed for aches and sorethroat  -in the winter time, using a humidifier at night is helpful (please follow cleaning instructions)  -if you are taking a cough  medication - use only as directed, may also try a teaspoon of honey to coat the throat and throat lozenges  -for sore throat, salt water gargles can help  -follow up if you have fevers, facial pain, tooth pain, difficulty breathing or are worsening or not getting better in 5-7 days      Alzena Gerber R.

## 2014-11-25 ENCOUNTER — Other Ambulatory Visit: Payer: Self-pay | Admitting: Internal Medicine

## 2014-12-06 ENCOUNTER — Emergency Department (HOSPITAL_COMMUNITY)
Admission: EM | Admit: 2014-12-06 | Discharge: 2014-12-06 | Disposition: A | Discharge status: Home / Self Care | Payer: 59 | Attending: Emergency Medicine | Admitting: Emergency Medicine

## 2014-12-06 ENCOUNTER — Encounter (HOSPITAL_COMMUNITY): Payer: Self-pay | Admitting: Emergency Medicine

## 2014-12-06 DIAGNOSIS — E039 Hypothyroidism, unspecified: Secondary | ICD-10-CM | POA: Insufficient documentation

## 2014-12-06 DIAGNOSIS — I10 Essential (primary) hypertension: Secondary | ICD-10-CM | POA: Insufficient documentation

## 2014-12-06 DIAGNOSIS — M545 Low back pain: Secondary | ICD-10-CM | POA: Diagnosis present

## 2014-12-06 DIAGNOSIS — R6883 Chills (without fever): Secondary | ICD-10-CM | POA: Diagnosis not present

## 2014-12-06 DIAGNOSIS — R109 Unspecified abdominal pain: Secondary | ICD-10-CM | POA: Diagnosis not present

## 2014-12-06 DIAGNOSIS — Z79899 Other long term (current) drug therapy: Secondary | ICD-10-CM | POA: Diagnosis not present

## 2014-12-06 DIAGNOSIS — Z8719 Personal history of other diseases of the digestive system: Secondary | ICD-10-CM | POA: Insufficient documentation

## 2014-12-06 DIAGNOSIS — Z86010 Personal history of colonic polyps: Secondary | ICD-10-CM | POA: Insufficient documentation

## 2014-12-06 MED ORDER — DIAZEPAM 5 MG PO TABS
5.0000 mg | ORAL_TABLET | Freq: Once | ORAL | Status: AC
  Administered 2014-12-06: 5 mg via ORAL
  Filled 2014-12-06: qty 1

## 2014-12-06 MED ORDER — IBUPROFEN 400 MG PO TABS
600.0000 mg | ORAL_TABLET | Freq: Once | ORAL | Status: AC
  Administered 2014-12-06: 600 mg via ORAL
  Filled 2014-12-06: qty 2

## 2014-12-06 MED ORDER — NAPROXEN 375 MG PO TABS: 375.0000 mg | ORAL_TABLET | Freq: Two times a day (BID) | ORAL | Status: DC | PRN

## 2014-12-06 MED ORDER — DIAZEPAM 5 MG PO TABS: 2.5000 mg | ORAL_TABLET | Freq: Three times a day (TID) | ORAL | Status: DC | PRN

## 2014-12-06 MED ORDER — HYDROCODONE-ACETAMINOPHEN 5-325 MG PO TABS: 1.0000 | ORAL_TABLET | ORAL | Status: DC | PRN

## 2014-12-06 MED ORDER — HYDROMORPHONE HCL 1 MG/ML IJ SOLN
1.0000 mg | Freq: Once | INTRAMUSCULAR | Status: AC
Start: 2014-12-06 — End: 2014-12-06
  Administered 2014-12-06: 1 mg via INTRAMUSCULAR

## 2014-12-06 MED ORDER — HYDROMORPHONE HCL 1 MG/ML IJ SOLN
1.0000 mg | Freq: Once | INTRAMUSCULAR | Status: DC
  Filled 2014-12-06: qty 1

## 2014-12-06 NOTE — ED Notes (Addendum)
Patient given discharge instruction, verbalized understand. Patient  Wheelchair out of the department.

## 2014-12-06 NOTE — ED Provider Notes (Signed)
CSN: [id number]     Arrival date & time 12/06/14  1218 History  This chart was scribed for Amy Manifold, MD by Chester Holstein, ED Scribe. This patient was seen in room [facility] and the patient's care was started at 4:59 PM.     Chief Complaint  Patient presents with  . Back Pain  . Abdominal Pain    Patient is a 64 y.o. female presenting with back pain and abdominal pain. The history is provided by the patient. No language interpreter was used.  Back Pain Associated symptoms: abdominal pain   Associated symptoms: no dysuria, no fever, no numbness and no weakness   Abdominal Pain Associated symptoms: chills   Associated symptoms: no dysuria and no fever     HPI Comments: [name] is a 64 y.o. female with h/o hypothyroidism, tubulovillous adenoma, internal hemorrhoids, melanosis coli, HA, and HTN who presents to the Emergency Department complaining of muscles spasms in middle of lower back with onset yesterday.  Pt notes associated chills. Pt states pain worsens when trying to use the restroom.  Pt has taken hydrocodone for relief. Pt has NKDA. Pt denies any recent injury or strain, dysuria, any numbness, weakness, or tingling, urinary incontinence, and fever.   Past Medical History  Diagnosis Date  . Hypothyroidism   . Tubulovillous adenoma 06/2008    1cm flat ascending colon  . Internal hemorrhoids   . Melanosis coli   . Headache(784.0)   . HTN (hypertension)    Past Surgical History  Procedure Laterality Date  . Tubal ligation  1977  . Vaginal delivery    . Diagnostic mammogram    . Colonoscopy  06/04/08    moderate internal hemorrhoids/melanosis coli/tubulovillous adenoma 1cm flat  . Cataract extraction  2011    bilateral  . Colonoscopy  12/17/2011    Procedure: COLONOSCOPY;  Surgeon: Dorothyann Peng, MD;  Location: [facility];  Service: Endoscopy;  Laterality: N/A;  12:30   Family History  Problem Relation Age of Onset  . Colon cancer Neg Hx   . Lung cancer  Neg Hx   . Ovarian cancer Neg Hx   . Breast cancer Sister     two, one deceased  . Cancer Sister     breast ca  . Pancreatic cancer Mother     deceased, 26  . Heart disease Mother   . Hypertension Mother    History  Substance Use Topics  . Smoking status: Never Smoker   . Smokeless tobacco: Never Used  . Alcohol Use: No   OB History    No data available     Review of Systems  Constitutional: Positive for chills. Negative for fever.  Gastrointestinal: Positive for abdominal pain.  Genitourinary: Negative for dysuria.  Musculoskeletal: Positive for back pain.  Neurological: Negative for weakness and numbness.  All other systems reviewed and are negative.     Allergies  Review of patient's allergies indicates no known allergies.  Home Medications   Prior to Admission medications   Medication Sig Start Date End Date Taking? Authorizing Provider  HYDROcodone-acetaminophen (NORCO/VICODIN) 5-325 MG per tablet Take 1 tablet by mouth every 6 (six) hours as needed for moderate pain.   Yes Historical Provider, MD  levothyroxine (SYNTHROID, LEVOTHROID) 112 MCG tablet TAKE 1 TABLET BY MOUTH ONCE DAILY 02/14/14  Yes Marletta Lor, MD  lisinopril-hydrochlorothiazide (PRINZIDE,ZESTORETIC) 10-12.5 MG per tablet TAKE 1 TABLET BY MOUTH ONCE DAILY 11/25/14  Yes Marletta Lor, MD   BP  115/71 mmHg  Pulse 58  Temp(Src) 98.2 F (36.8 C) (Oral)  Resp 18  Ht 5\' 7"  (1.702 m)  Wt 190 lb (86.183 kg)  BMI 29.75 kg/m2  SpO2 97% Physical Exam  Constitutional: She is oriented to person, place, and time. She appears well-developed and well-nourished. No distress.  HENT:  Head: Normocephalic and atraumatic.  Eyes: Conjunctivae are normal. Right eye exhibits no discharge. Left eye exhibits no discharge.  Neck: Neck supple.  Cardiovascular: Normal rate, regular rhythm and normal heart sounds.  Exam reveals no gallop and no friction rub.   No murmur heard. Pulmonary/Chest: Effort  normal and breath sounds normal. No respiratory distress.  Abdominal: Soft. She exhibits no distension. There is no tenderness.  Musculoskeletal: She exhibits no edema.       Lumbar back: She exhibits tenderness (mild midline paraspinal tenderness).  Sensation intact to light touch  Strength 5/5 to LE  Neurological: She is alert and oriented to person, place, and time. She has normal strength.  Skin: Skin is warm and dry.  Psychiatric: She has a normal mood and affect. Her behavior is normal. Thought content normal.  Nursing note and vitals reviewed.   ED Course  Procedures (including critical care time) DIAGNOSTIC STUDIES: Oxygen Saturation is 97% on room air, normal by my interpretation.    COORDINATION OF CARE: 5:07 PM Discussed treatment plan with patient at beside including use of NSAIDs with Rx for muscle relaxer and pain medication,  the patient agrees with the plan and has no further questions at this time.   Labs Review Labs Reviewed - No data to display  Imaging Review No results found.   EKG Interpretation None      MDM   Final diagnoses:  Low back pain without sciatica, unspecified back pain laterality   64 year old female with a genetic lower back pain without any "red flags." Plan symptomatic treatment. Return precautions discussed.  I personally preformed the services scribed in my presence. The recorded information has been reviewed is accurate. Amy Manifold, MD.     Amy Manifold, MD 12/12/14 1154

## 2014-12-06 NOTE — ED Notes (Signed)
Patient complaining of "lower back spasms" and abdominal pain since yesterday. Denies injury. States she has a history of back problems.

## 2014-12-12 ENCOUNTER — Ambulatory Visit (INDEPENDENT_AMBULATORY_CARE_PROVIDER_SITE_OTHER): Payer: 59 | Admitting: Internal Medicine

## 2014-12-12 ENCOUNTER — Encounter: Payer: Self-pay | Admitting: Internal Medicine

## 2014-12-12 VITALS — BP 120/80 | HR 64 | Temp 98.0°F | Resp 20 | Ht 67.0 in | Wt 190.0 lb

## 2014-12-12 DIAGNOSIS — M549 Dorsalgia, unspecified: Secondary | ICD-10-CM

## 2014-12-12 DIAGNOSIS — I10 Essential (primary) hypertension: Secondary | ICD-10-CM

## 2014-12-12 MED ORDER — HYDROCODONE-ACETAMINOPHEN 5-325 MG PO TABS: 1.0000 | ORAL_TABLET | ORAL | Status: DC | PRN

## 2014-12-12 NOTE — Progress Notes (Signed)
Pre visit review using our clinic review tool, if applicable. No additional management support is needed unless otherwise documented below in the visit note. 

## 2014-12-12 NOTE — Patient Instructions (Signed)
Most patients with low back pain will improve with time over the next two to 6 weeks.  Keep active but avoid any activities that cause pain.  Apply moist heat to the low back area several times daily.  Back Injury Prevention Back injuries can be extremely painful and difficult to heal. After having one back injury, you are much more likely to experience another later on. It is important to learn how to avoid injuring or re-injuring your back. The following tips can help you to prevent a back injury. PHYSICAL FITNESS  Exercise regularly and try to develop good tone in your abdominal muscles. Your abdominal muscles provide a lot of the support needed by your back.  Do aerobic exercises (walking, jogging, biking, swimming) regularly.  Do exercises that increase balance and strength (tai chi, yoga) regularly. This can decrease your risk of falling and injuring your back.  Stretch before and after exercising.  Maintain a healthy weight. The more you weigh, the more stress is placed on your back. For every pound of weight, 10 times that amount of pressure is placed on the back. DIET  Talk to your caregiver about how much calcium and vitamin D you need per day. These nutrients help to prevent weakening of the bones (osteoporosis). Osteoporosis can cause broken (fractured) bones that lead to back pain.  Include good sources of calcium in your diet, such as dairy products, green, leafy vegetables, and products with calcium added (fortified).  Include good sources of vitamin D in your diet, such as milk and foods that are fortified with vitamin D.  Consider taking a nutritional supplement or a multivitamin if needed.  Stop smoking if you smoke. POSTURE  Sit and stand up straight. Avoid leaning forward when you sit or hunching over when you stand.  Choose chairs with good low back (lumbar) support.  If you work at a desk, sit close to your work so you do not need to lean over. Keep your chin  tucked in. Keep your neck drawn back and elbows bent at a right angle. Your arms should look like the letter "L."  Sit high and close to the steering wheel when you drive. Add a lumbar support to your car seat if needed.  Avoid sitting or standing in one position for too long. Take breaks to get up, stretch, and walk around at least once every hour. Take breaks if you are driving for long periods of time.  Sleep on your side with your knees slightly bent, or sleep on your back with a pillow under your knees. Do not sleep on your stomach. LIFTING, TWISTING, AND REACHING  Avoid heavy lifting, especially repetitive lifting. If you must do heavy lifting:  Stretch before lifting.  Work slowly.  Rest between lifts.  Use carts and dollies to move objects when possible.  Make several small trips instead of carrying 1 heavy load.  Ask for help when you need it.  Ask for help when moving big, awkward objects.  Follow these steps when lifting:  Stand with your feet shoulder-width apart.  Get as close to the object as you can. Do not try to pick up heavy objects that are far from your body.  Use handles or lifting straps if they are available.  Bend at your knees. Squat down, but keep your heels off the floor.  Keep your shoulders pulled back, your chin tucked in, and your back straight.  Lift the object slowly, tightening the muscles in your legs,   abdomen, and buttocks. Keep the object as close to the center of your body as possible.  When you put a load down, use these same guidelines in reverse.  Do not:  Lift the object above your waist.  Twist at the waist while lifting or carrying a load. Move your feet if you need to turn, not your waist.  Bend over without bending at your knees.  Avoid reaching over your head, across a table, or for an object on a high surface. OTHER TIPS  Avoid wet floors and keep sidewalks clear of ice to prevent falls.  Do not sleep on a mattress  that is too soft or too hard.  Keep items that are used frequently within easy reach.  Put heavier objects on shelves at waist level and lighter objects on lower or higher shelves.  Find ways to decrease your stress, such as exercise, massage, or relaxation techniques. Stress can build up in your muscles. Tense muscles are more vulnerable to injury.  Seek treatment for depression or anxiety if needed. These conditions can increase your risk of developing back pain.    HOME CARE INSTRUCTIONS  Avoid hard physical activities (tennis, racquetball, waterskiing) if you are not in proper physical condition for it. This may aggravate or create problems.  If you have a back problem, avoid sports requiring sudden body movements. Swimming and walking are generally safer activities.  Maintain good posture.  Maintain a healthy weight.  When the low back starts healing, stretching and strengthening exercises may be recommended. SEEK MEDICAL CARE IF:  Your back pain is getting worse.  You experience severe back pain not relieved with medicines. SEEK IMMEDIATE MEDICAL CARE IF:  You have numbness, tingling, weakness, or problems with the use of your arms or legs.  There is a change in bowel or bladder control.  You have increasing pain in any area of the body, including your belly (abdomen).  You notice shortness of breath, dizziness, or feel faint.  You feel sick to your stomach (nauseous), are throwing up (vomiting), or become sweaty.  You notice discoloration of your toes or legs, or your feet get very cold.

## 2014-12-12 NOTE — Progress Notes (Signed)
Subjective:    Patient ID: [name], female    DOB: Nov 10, 1950, 64 y.o.   MRN: [id number]  HPI 65 year old patient who presents with a seven-day history of low back pain.  She has a history of upper back discomfort in the interscapular area, but no prior history of low back pain.  Pain is described as tight and tense and she feels it is like a muscle spasm.  She was seen in the ED 6 days ago and has been on analgesics.  She is modestly improved but still having some discomfort.  Pain seems worse when she first gets up in the morning and improves throughout the day until late in the afternoon when she again has worsening discomfort.  There is no motor weakness or radicular component. She has treated hypertension  Past Medical History  Diagnosis Date  . Hypothyroidism   . Tubulovillous adenoma 06/2008    1cm flat ascending colon  . Internal hemorrhoids   . Melanosis coli   . Headache(784.0)   . HTN (hypertension)     History   Social History  . Marital Status: Widowed    Spouse Name: N/A  . Number of Children: 1  . Years of Education: N/A   Occupational History  . 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 Smokeless tobacco: Never Used  . Alcohol Use: No  . Drug Use: No  . Sexual Activity: Yes   Other Topics Concern  . Not on file   Social History Narrative    Past Surgical History  Procedure Laterality Date  . Tubal ligation  1977  . Vaginal delivery    . Diagnostic mammogram    . Colonoscopy  06/04/08    moderate internal hemorrhoids/melanosis coli/tubulovillous adenoma 1cm flat  . Cataract extraction  2011    bilateral  . Colonoscopy  12/17/2011    Procedure: COLONOSCOPY;  Surgeon: Dorothyann Peng, MD;  Location: [facility];  Service: Endoscopy;  Laterality: N/A;  12:30    Family History  Problem Relation Age of Onset  . Colon cancer Neg Hx   . Lung cancer Neg Hx   . Ovarian cancer Neg Hx   .  Breast cancer Sister     two, one deceased  . Cancer Sister     breast ca  . Pancreatic cancer Mother     deceased, 19  . Heart disease Mother   . Hypertension Mother     No Known Allergies  Current Outpatient Prescriptions on File Prior to Visit  Medication Sig Dispense Refill  . diazepam (VALIUM) 5 MG tablet Take 0.5-1 tablets (2.5-5 mg total) by mouth every 8 (eight) hours as needed for muscle spasms. 12 tablet 0  . HYDROcodone-acetaminophen (NORCO/VICODIN) 5-325 MG per tablet Take 1-2 tablets by mouth every 4 (four) hours as needed. 15 tablet 0  . levothyroxine (SYNTHROID, LEVOTHROID) 112 MCG tablet TAKE 1 TABLET BY MOUTH ONCE DAILY 90 tablet 3  . lisinopril-hydrochlorothiazide (PRINZIDE,ZESTORETIC) 10-12.5 MG per tablet TAKE 1 TABLET BY MOUTH ONCE DAILY 90 tablet 0  . naproxen (NAPROSYN) 375 MG tablet Take 1 tablet (375 mg total) by mouth 2 (two) times daily as needed. 20 tablet 0   No current facility-administered medications on file prior to visit.    BP 120/80 mmHg  Pulse 64  Temp(Src) 98 F (36.7 C) (Oral)  Resp 20  Ht 5\' 7"  (1.702 m)  Wt 190 lb (  86.183 kg)  BMI 29.75 kg/m2  SpO2 97%      Review of Systems  Constitutional: Negative.   HENT: Negative for congestion, dental problem, hearing loss, rhinorrhea, sinus pressure, sore throat and tinnitus.   Eyes: Negative for pain, discharge and visual disturbance.  Respiratory: Negative for cough and shortness of breath.   Cardiovascular: Negative for chest pain, palpitations and leg swelling.  Gastrointestinal: Negative for nausea, vomiting, abdominal pain, diarrhea, constipation, blood in stool and abdominal distention.  Genitourinary: Negative for dysuria, urgency, frequency, hematuria, flank pain, vaginal bleeding, vaginal discharge, difficulty urinating, vaginal pain and pelvic pain.  Musculoskeletal: Positive for back pain. Negative for joint swelling, arthralgias and gait problem.  Skin: Negative for rash.    Neurological: Negative for dizziness, syncope, speech difficulty, weakness, numbness and headaches.  Hematological: Negative for adenopathy.  Psychiatric/Behavioral: Negative for behavioral problems, dysphoric mood and agitation. The patient is not nervous/anxious.        Objective:   Physical Exam  Constitutional: She appears well-developed and well-nourished. No distress.  Abdominal: Soft. Bowel sounds are normal. She exhibits no distension. There is no tenderness. There is no rebound and no guarding.  Musculoskeletal:  Negative straight leg test Range of motion of the hips intact.  Full hip flexion does cause discomfort in the lumbar area  Mild discomfort in the lumbar area to palpation  Motor strength normal Reflexes intact and symmetrical          Assessment & Plan:   Low back pain.  Suspect more muscle ligamentous will continue symptomatic therapy, rest and heat therapy Will continue naproxen twice daily Hypertension, stable

## 2015-02-27 ENCOUNTER — Other Ambulatory Visit: Payer: Self-pay | Admitting: Internal Medicine

## 2015-03-11 ENCOUNTER — Other Ambulatory Visit: Payer: Self-pay | Admitting: Internal Medicine

## 2015-03-11 DIAGNOSIS — Z1231 Encounter for screening mammogram for malignant neoplasm of breast: Secondary | ICD-10-CM

## 2015-03-14 ENCOUNTER — Ambulatory Visit (HOSPITAL_COMMUNITY)
Admission: RE | Admit: 2015-03-14 | Discharge: 2015-03-14 | Disposition: A | Discharge status: Home / Self Care | Payer: 59 | Source: Ambulatory Visit | Attending: Internal Medicine | Admitting: Internal Medicine

## 2015-03-14 DIAGNOSIS — Z1231 Encounter for screening mammogram for malignant neoplasm of breast: Secondary | ICD-10-CM | POA: Insufficient documentation

## 2015-04-03 ENCOUNTER — Encounter: Payer: Self-pay | Admitting: Family Medicine

## 2015-04-03 ENCOUNTER — Ambulatory Visit (INDEPENDENT_AMBULATORY_CARE_PROVIDER_SITE_OTHER): Payer: 59 | Admitting: Family Medicine

## 2015-04-03 VITALS — BP 110/76 | HR 88 | Temp 97.7°F | Ht 67.0 in | Wt 189.0 lb

## 2015-04-03 DIAGNOSIS — K5900 Constipation, unspecified: Secondary | ICD-10-CM | POA: Diagnosis not present

## 2015-04-03 DIAGNOSIS — H9201 Otalgia, right ear: Secondary | ICD-10-CM | POA: Diagnosis not present

## 2015-04-03 NOTE — Progress Notes (Signed)
Pre visit review using our clinic review tool, if applicable. No additional management support is needed unless otherwise documented below in the visit note. 

## 2015-04-03 NOTE — Progress Notes (Signed)
HPI:  ? Foreign body in ear R: -she cleaned ear and when took out qtip it did not have cotton on it - 1 week ago -now this ear feels a little full and thinks may have end of qtip in it -denies: hearing loss, pain, drainage  Chronic constipation: -her whole life -has tried some OTC laxitives and did not like them -does not take fiber supplement -denies: pain, change in bowels hematochezia, melena  ROS: See pertinent positives and negatives per HPI.  Past Medical History  Diagnosis Date  . Hypothyroidism   . Tubulovillous adenoma 06/2008    1cm flat ascending colon  . Internal hemorrhoids   . Melanosis coli   . Headache(784.0)   . HTN (hypertension)     Past Surgical History  Procedure Laterality Date  . Tubal ligation  1977  . Vaginal delivery    . Diagnostic mammogram    . Colonoscopy  06/04/08    moderate internal hemorrhoids/melanosis coli/tubulovillous adenoma 1cm flat  . Cataract extraction  2011    bilateral  . Colonoscopy  12/17/2011    Procedure: COLONOSCOPY;  Surgeon: Dorothyann Peng, MD;  Location: [facility];  Service: Endoscopy;  Laterality: N/A;  12:30    Family History  Problem Relation Age of Onset  . Colon cancer Neg Hx   . Lung cancer Neg Hx   . Ovarian cancer Neg Hx   . Breast cancer Sister     two, one deceased  . Cancer Sister     breast ca  . Pancreatic cancer Mother     deceased, 71  . Heart disease Mother   . Hypertension Mother     History   Social History  . Marital Status: Widowed    Spouse Name: N/A  . Number of Children: 1  . Years of Education: N/A   Occupational History  . 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 Smokeless tobacco: Never Used  . Alcohol Use: No  . Drug Use: No  . Sexual Activity: Yes   Other Topics Concern  . None   Social History Narrative     Current outpatient prescriptions:  .  diazepam (VALIUM) 5 MG tablet, Take 0.5-1 tablets (2.5-5 mg  total) by mouth every 8 (eight) hours as needed for muscle spasms., Disp: 12 tablet, Rfl: 0 .  HYDROcodone-acetaminophen (NORCO/VICODIN) 5-325 MG per tablet, Take 1-2 tablets by mouth every 4 (four) hours as needed., Disp: 30 tablet, Rfl: 0 .  levothyroxine (SYNTHROID, LEVOTHROID) 112 MCG tablet, TAKE 1 TABLET BY MOUTH ONCE DAILY, Disp: 90 tablet, Rfl: 0 .  lisinopril-hydrochlorothiazide (PRINZIDE,ZESTORETIC) 10-12.5 MG per tablet, TAKE 1 TABLET BY MOUTH ONCE DAILY (NEED PHYSICAL), Disp: 90 tablet, Rfl: 0 .  naproxen (NAPROSYN) 375 MG tablet, Take 1 tablet (375 mg total) by mouth 2 (two) times daily as needed., Disp: 20 tablet, Rfl: 0  EXAM:  Filed Vitals:   04/03/15 1101  BP: 110/76  Pulse: 88  Temp: 97.7 F (36.5 C)    Body mass index is 29.59 kg/(m^2).  GENERAL: vitals reviewed and listed above, alert, oriented, appears well hydrated and in no acute distress  HEENT: atraumatic, conjunttiva clear, no obvious abnormalities on inspection of external nose and ears  NECK: no obvious masses on inspection  LUNGS: clear to auscultation bilaterally, no wheezes, rales or rhonchi, good air movement  CV: HRRR, no peripheral edema  MS: moves all extremities without noticeable abnormality  PSYCH: pleasant and cooperative, no obvious depression or anxiety  ASSESSMENT AND PLAN:  Discussed the following assessment and plan:  Ear discomfort, right -no findings on exam, monitor, RTC if any further symptoms -advised avoiding objects in ear canal   Constipation, unspecified constipation type -colonoscopy UTD -advised trial daily fiber supplement, prn mrilax (discussed proper use) -follow up with PCP if not resolving in 1 month or sooner if needed  -Patient advised to return or notify a doctor immediately if symptoms worsen or persist or new concerns arise.  There are no Patient Instructions on file for this visit.   Colin Benton R.

## 2015-06-10 ENCOUNTER — Other Ambulatory Visit: Payer: Self-pay | Admitting: Family Medicine

## 2015-06-10 NOTE — Telephone Encounter (Signed)
Pt has had no lab work.

## 2015-06-11 ENCOUNTER — Ambulatory Visit (INDEPENDENT_AMBULATORY_CARE_PROVIDER_SITE_OTHER): Payer: 59 | Admitting: Internal Medicine

## 2015-06-11 ENCOUNTER — Encounter: Payer: Self-pay | Admitting: Internal Medicine

## 2015-06-11 VITALS — BP 120/78 | HR 64 | Temp 98.1°F | Wt 186.0 lb

## 2015-06-11 DIAGNOSIS — M549 Dorsalgia, unspecified: Secondary | ICD-10-CM | POA: Diagnosis not present

## 2015-06-11 DIAGNOSIS — I10 Essential (primary) hypertension: Secondary | ICD-10-CM

## 2015-06-11 DIAGNOSIS — E039 Hypothyroidism, unspecified: Secondary | ICD-10-CM

## 2015-06-11 DIAGNOSIS — R944 Abnormal results of kidney function studies: Secondary | ICD-10-CM | POA: Diagnosis not present

## 2015-06-11 LAB — COMPREHENSIVE METABOLIC PANEL
ALT: 35 U/L (ref 0–35)
AST: 37 U/L (ref 0–37)
Albumin: 4.2 g/dL (ref 3.5–5.2)
Alkaline Phosphatase: 76 U/L (ref 39–117)
BILIRUBIN TOTAL: 1 mg/dL (ref 0.2–1.2)
BUN: 14 mg/dL (ref 6–23)
CHLORIDE: 104 meq/L (ref 96–112)
CO2: 29 meq/L (ref 19–32)
CREATININE: 1.01 mg/dL (ref 0.40–1.20)
Calcium: 10.1 mg/dL (ref 8.4–10.5)
GFR: 70.99 mL/min (ref >60.00)
Glucose, Bld: 96 mg/dL (ref 70–99)
Potassium: 4.9 mEq/L (ref 3.5–5.1)
SODIUM: 140 meq/L (ref 135–145)
TOTAL PROTEIN: 7.5 g/dL (ref 6.0–8.3)

## 2015-06-11 LAB — CBC WITH DIFFERENTIAL/PLATELET
BASOS ABS: 0 10*3/uL (ref 0.0–0.1)
Basophils Relative: 0.8 % (ref 0.0–3.0)
Eosinophils Absolute: 0.2 10*3/uL (ref 0.0–0.7)
Eosinophils Relative: 4.2 % (ref 0.0–5.0)
HEMATOCRIT: 44.2 % (ref 36.0–46.0)
HEMOGLOBIN: 14.5 g/dL (ref 12.0–15.0)
LYMPHS ABS: 2.2 10*3/uL (ref 0.7–4.0)
Lymphocytes Relative: 40.9 % (ref 12.0–46.0)
MCHC: 32.8 g/dL (ref 30.0–36.0)
MCV: 89.8 fl (ref 78.0–100.0)
MONO ABS: 0.5 10*3/uL (ref 0.1–1.0)
MONOS PCT: 9.9 % (ref 3.0–12.0)
NEUTROS ABS: 2.3 10*3/uL (ref 1.4–7.7)
Neutrophils Relative %: 44.2 % (ref 43.0–77.0)
Platelets: 255 10*3/uL (ref 150.0–400.0)
RBC: 4.92 Mil/uL (ref 3.87–5.11)
RDW: 12.6 % (ref 11.5–15.5)
WBC: 5.3 10*3/uL (ref 4.0–10.5)

## 2015-06-11 LAB — LIPID PANEL
Cholesterol: 188 mg/dL (ref 0–200)
HDL: 30.7 mg/dL — AB (ref >39.00)
LDL Cholesterol: 130 mg/dL — ABNORMAL HIGH (ref 0–99)
NONHDL: 157.1
TRIGLYCERIDES: 138 mg/dL (ref 0.0–149.0)
Total CHOL/HDL Ratio: 6
VLDL: 27.6 mg/dL (ref 0.0–40.0)

## 2015-06-11 LAB — TSH: TSH: 0.46 u[IU]/mL (ref 0.35–4.50)

## 2015-06-11 MED ORDER — LISINOPRIL-HYDROCHLOROTHIAZIDE 10-12.5 MG PO TABS: ORAL_TABLET | ORAL | Status: DC

## 2015-06-11 MED ORDER — LEVOTHYROXINE SODIUM 112 MCG PO TABS: 112.0000 ug | ORAL_TABLET | Freq: Every day | ORAL | Status: DC

## 2015-06-11 MED ORDER — ONDANSETRON HCL 4 MG PO TABS: 4.0000 mg | ORAL_TABLET | Freq: Three times a day (TID) | ORAL | Status: DC | PRN

## 2015-06-11 NOTE — Progress Notes (Signed)
Subjective:    Patient ID: [name], female    DOB: November 17, 1950, 64 y.o.   MRN: [id number]  HPI  64 year old patient who is seen today in follow-up.  She has a history of hypothyroidism and treated hypertension.  She will be leaving on a [place] cruise and is requesting motion sickness medication. Chronic complaints include chronic low back pain as well as constipation. The patient has been evaluated by rheumatology and neurosurgery.  Constipation.  Measures discussed.  She continues to have left mid back pain.  Past Medical History  Diagnosis Date  . Hypothyroidism   . Tubulovillous adenoma 06/2008    1cm flat ascending colon  . Internal hemorrhoids   . Melanosis coli   . Headache(784.0)   . HTN (hypertension)     Social History   Social History  . Marital Status: Widowed    Spouse Name: N/A  . Number of Children: 1  . Years of Education: N/A   Occupational History  . 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 Smoking status: Never Smoker   . Smokeless tobacco: Never Used  . Alcohol Use: No  . Drug Use: No  . Sexual Activity: Yes   Other Topics Concern  . Not on file   Social History Narrative    Past Surgical History  Procedure Laterality Date  . Tubal ligation  1977  . Vaginal delivery    . Diagnostic mammogram    . Colonoscopy  06/04/08    moderate internal hemorrhoids/melanosis coli/tubulovillous adenoma 1cm flat  . Cataract extraction  2011    bilateral  . Colonoscopy  12/17/2011    Procedure: COLONOSCOPY;  Surgeon: Dorothyann Peng, MD;  [facility];  Service: Endoscopy;  Laterality: N/A;  12:30    Family History  Problem Relation Age of Onset  . Colon cancer Neg Hx   . Lung cancer Neg Hx   . Ovarian cancer Neg Hx   . Breast cancer Sister     two, one deceased  . Cancer Sister     breast ca  . Pancreatic cancer Mother     deceased, 65  . Heart disease Mother   . Hypertension Mother     No Known  Allergies  Current Outpatient Prescriptions on File Prior to Visit  Medication Sig Dispense Refill  . HYDROcodone-acetaminophen (NORCO/VICODIN) 5-325 MG per tablet Take 1-2 tablets by mouth every 4 (four) hours as needed. 30 tablet 0  . diazepam (VALIUM) 5 MG tablet Take 0.5-1 tablets (2.5-5 mg total) by mouth every 8 (eight) hours as needed for muscle spasms. (Patient not taking: Reported on 06/11/2015) 12 tablet 0  . levothyroxine (SYNTHROID, LEVOTHROID) 112 MCG tablet Take 1 tablet (112 mcg total) by mouth daily. 90 tablet 0  . lisinopril-hydrochlorothiazide (PRINZIDE,ZESTORETIC) 10-12.5 MG per tablet TAKE 1 TABLET BY MOUTH ONCE DAILY (NEED PHYSICAL) 90 tablet 0  . naproxen (NAPROSYN) 375 MG tablet Take 1 tablet (375 mg total) by mouth 2 (two) times daily as needed. (Patient not taking: Reported on 06/11/2015) 20 tablet 0   No current facility-administered medications on file prior to visit.    BP 120/78 mmHg  Pulse 64  Temp(Src) 98.1 F (36.7 C) (Oral)  Wt 186 lb (84.369 kg)     Review of Systems  Constitutional: Negative.   HENT: Negative for congestion, dental problem, hearing loss, rhinorrhea, sinus pressure, sore throat and tinnitus.   Eyes: Negative for pain, discharge and visual disturbance.  Respiratory: Negative for cough and shortness of breath.   Cardiovascular: Negative for chest pain, palpitations and leg swelling.  Gastrointestinal: Negative for nausea, vomiting, abdominal pain, diarrhea, constipation, blood in stool and abdominal distention.  Genitourinary: Negative for dysuria, urgency, frequency, hematuria, flank pain, vaginal bleeding, vaginal discharge, difficulty urinating, vaginal pain and pelvic pain.  Musculoskeletal: Positive for back pain. Negative for joint swelling, arthralgias and gait problem.  Skin: Negative for rash.  Neurological: Negative for dizziness, syncope, speech difficulty, weakness, numbness and headaches.  Hematological: Negative for  adenopathy.  Psychiatric/Behavioral: Negative for behavioral problems, dysphoric mood and agitation. The patient is not nervous/anxious.        Objective:   Physical Exam  Constitutional: She is oriented to person, place, and time. She appears well-developed and well-nourished.  HENT:  Head: Normocephalic.  Right Ear: External ear normal.  Left Ear: External ear normal.  Mouth/Throat: Oropharynx is clear and moist.  Eyes: Conjunctivae and EOM are normal. Pupils are equal, round, and reactive to light.  Neck: Normal range of motion. Neck supple. No thyromegaly present.  Cardiovascular: Normal rate, regular rhythm, normal heart sounds and intact distal pulses.   Pulmonary/Chest: Effort normal and breath sounds normal.  Abdominal: Soft. Bowel sounds are normal. She exhibits no mass. There is no tenderness.  Musculoskeletal: Normal range of motion.  Lymphadenopathy:    She has no cervical adenopathy.  Neurological: She is alert and oriented to person, place, and time.  Skin: Skin is warm and dry. No rash noted.  Psychiatric: She has a normal mood and affect. Her behavior is normal.          Assessment & Plan:   Constipation.  Measures discussed.  She has tried MiraLAX in the past with suboptimal results.  Will increase frequency Hypertension Chronic back pain.  Follow-up neurosurgery Hypothyroidism   CPX 6 months

## 2015-06-11 NOTE — Patient Instructions (Signed)
Limit your sodium (Salt) intake  Please check your blood pressure on a regular basis.  If it is consistently greater than 150/90, please make an office appointment.  Return in 6 months for follow-up   

## 2015-06-11 NOTE — Progress Notes (Signed)
Pre visit review using our clinic review tool, if applicable. No additional management support is needed unless otherwise documented below in the visit note. 

## 2015-06-12 ENCOUNTER — Telehealth: Payer: Self-pay | Admitting: Internal Medicine

## 2015-06-12 ENCOUNTER — Other Ambulatory Visit: Payer: Self-pay | Admitting: Internal Medicine

## 2015-06-12 NOTE — Telephone Encounter (Signed)
Refill ok? 

## 2015-06-12 NOTE — Telephone Encounter (Signed)
Pt need new rx hydrocodone °

## 2015-06-16 MED ORDER — HYDROCODONE-ACETAMINOPHEN 5-325 MG PO TABS: 1.0000 | ORAL_TABLET | ORAL | Status: DC | PRN

## 2015-06-16 NOTE — Telephone Encounter (Signed)
Rx printed and signed.  

## 2015-06-16 NOTE — Telephone Encounter (Signed)
ok 

## 2015-06-16 NOTE — Telephone Encounter (Signed)
Pt is aware rx is ready for pick up. 

## 2015-09-17 ENCOUNTER — Other Ambulatory Visit: Payer: Self-pay | Admitting: *Deleted

## 2015-09-17 MED ORDER — LEVOTHYROXINE SODIUM 112 MCG PO TABS: 112.0000 ug | ORAL_TABLET | Freq: Every day | ORAL | Status: DC

## 2015-09-18 ENCOUNTER — Other Ambulatory Visit: Payer: Self-pay | Admitting: Internal Medicine

## 2015-09-18 NOTE — Telephone Encounter (Signed)
Patient called regarding Rx refill on lisinopril-hydrochlorothiazide (PRINZIDE,ZESTORETIC) 10-12.5 MG per tablet  Patient states that the pharmacy said she had to have OV before this medication can be refilled. Patient would like a call about this RX refill

## 2015-12-03 ENCOUNTER — Ambulatory Visit (INDEPENDENT_AMBULATORY_CARE_PROVIDER_SITE_OTHER): Payer: 59 | Admitting: Internal Medicine

## 2015-12-03 ENCOUNTER — Encounter: Payer: Self-pay | Admitting: Internal Medicine

## 2015-12-03 VITALS — BP 110/70 | HR 71 | Temp 98.0°F | Resp 20 | Ht 67.0 in | Wt 192.0 lb

## 2015-12-03 DIAGNOSIS — I10 Essential (primary) hypertension: Secondary | ICD-10-CM | POA: Diagnosis not present

## 2015-12-03 DIAGNOSIS — E039 Hypothyroidism, unspecified: Secondary | ICD-10-CM | POA: Diagnosis not present

## 2015-12-03 MED ORDER — HYDROCODONE-ACETAMINOPHEN 5-325 MG PO TABS: 1.0000 | ORAL_TABLET | ORAL | Status: DC | PRN

## 2015-12-03 NOTE — Progress Notes (Signed)
Pre visit review using our clinic review tool, if applicable. No additional management support is needed unless otherwise documented below in the visit note. 

## 2015-12-03 NOTE — Progress Notes (Signed)
Subjective:    Patient ID: [name], female    DOB: 1951/08/08, 65 y.o.   MRN: [id number]  HPI 65 year old patient who is seen today in follow-up.  She has essential hypertension.  She is doing quite well without concerns or complaints.  She does have occasional low back pain.  She has hypothyroidism, essential hypertension which has been very well controlled.  No new concerns or complaints today  Past Medical History  Diagnosis Date  . Hypothyroidism   . Tubulovillous adenoma 06/2008    1cm flat ascending colon  . Internal hemorrhoids   . Melanosis coli   . Headache(784.0)   . HTN (hypertension)     Social History   Social History  . Marital Status: Widowed    Spouse Name: N/A  . Number of Children: 1  . Years of Education: N/A   Occupational History  . 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 Smokeless tobacco: Never Used  . Alcohol Use: No  . Drug Use: No  . Sexual Activity: Yes   Other Topics Concern  . Not on file   Social History Narrative    Past Surgical History  Procedure Laterality Date  . Tubal ligation  1977  . Vaginal delivery    . Diagnostic mammogram    . Colonoscopy  06/04/08    moderate internal hemorrhoids/melanosis coli/tubulovillous adenoma 1cm flat  . Cataract extraction  2011    bilateral  . Colonoscopy  12/17/2011    Procedure: COLONOSCOPY;  Surgeon: Dorothyann Peng, MD;  [facility];  Service: Endoscopy;  Laterality: N/A;  12:30    Family History  Problem Relation Age of Onset  . Colon cancer Neg Hx   . Lung cancer Neg Hx   . Ovarian cancer Neg Hx   . Breast cancer Sister     two, one deceased  . Cancer Sister     breast ca  . Pancreatic cancer Mother     deceased, 47  . Heart disease Mother   . Hypertension Mother     No Known Allergies  Current Outpatient Prescriptions on File Prior to Visit  Medication Sig Dispense Refill  .  HYDROcodone-acetaminophen (NORCO/VICODIN) 5-325 MG per tablet Take 1-2 tablets by mouth every 4 (four) hours as needed. 30 tablet 0  . levothyroxine (SYNTHROID, LEVOTHROID) 112 MCG tablet Take 1 tablet (112 mcg total) by mouth daily. 90 tablet 0  . lisinopril-hydrochlorothiazide (PRINZIDE,ZESTORETIC) 10-12.5 MG tablet Take 1 tablet by mouth daily. 90 tablet 0  . naproxen (NAPROSYN) 375 MG tablet Take 1 tablet (375 mg total) by mouth 2 (two) times daily as needed. (Patient not taking: Reported on 12/03/2015) 20 tablet 0   No current facility-administered medications on file prior to visit.    BP 110/70 mmHg  Pulse 71  Temp(Src) 98 F (36.7 C) (Oral)  Resp 20  Ht 5\' 7"  (1.702 m)  Wt 192 lb (87.091 kg)  BMI 30.06 kg/m2  SpO2 98%      Review of Systems  Constitutional: Negative.   HENT: Negative for congestion, dental problem, hearing loss, rhinorrhea, sinus pressure, sore throat and tinnitus.   Eyes: Negative for pain, discharge and visual disturbance.  Respiratory: Negative for cough and shortness of breath.   Cardiovascular: Negative for chest pain, palpitations and leg swelling.  Gastrointestinal: Negative for nausea, vomiting, abdominal pain, diarrhea, constipation, blood in stool and abdominal distention.  Genitourinary: Negative  for dysuria, urgency, frequency, hematuria, flank pain, vaginal bleeding, vaginal discharge, difficulty urinating, vaginal pain and pelvic pain.  Musculoskeletal: Positive for back pain. Negative for joint swelling, arthralgias and gait problem.  Skin: Negative for rash.  Neurological: Negative for dizziness, syncope, speech difficulty, weakness, numbness and headaches.  Hematological: Negative for adenopathy.  Psychiatric/Behavioral: Negative for behavioral problems, dysphoric mood and agitation. The patient is not nervous/anxious.        Objective:   Physical Exam  Constitutional: She is oriented to person, place, and time. She appears  well-developed and well-nourished.  HENT:  Head: Normocephalic.  Right Ear: External ear normal.  Left Ear: External ear normal.  Mouth/Throat: Oropharynx is clear and moist.  Eyes: Conjunctivae and EOM are normal. Pupils are equal, round, and reactive to light.  Neck: Normal range of motion. Neck supple. No thyromegaly present.  Cardiovascular: Normal rate, regular rhythm, normal heart sounds and intact distal pulses.   Pulmonary/Chest: Effort normal and breath sounds normal.  Abdominal: Soft. Bowel sounds are normal. She exhibits no mass. There is no tenderness.  Musculoskeletal: Normal range of motion.  Lymphadenopathy:    She has no cervical adenopathy.  Neurological: She is alert and oriented to person, place, and time.  Skin: Skin is warm and dry. No rash noted.  Psychiatric: She has a normal mood and affect. Her behavior is normal.          Assessment & Plan:   Essential hypertension.  Excellent control Hypothyroidism Modest weight gain.  Diet regular.  Exercise encouraged.  Patient seems motivated to lose some weight.  This spring   Medications updated

## 2015-12-03 NOTE — Patient Instructions (Signed)

## 2015-12-04 ENCOUNTER — Ambulatory Visit: Payer: 59 | Admitting: Internal Medicine

## 2015-12-26 ENCOUNTER — Other Ambulatory Visit: Payer: Self-pay | Admitting: Internal Medicine

## 2015-12-26 MED FILL — LEVOTHYROXINE 112 MCG TAB: 112 | 90 days supply | Qty: 90 | Fill #0

## 2015-12-26 MED FILL — LISINOPRIL-HCTZ 10-12.5 MG: 10-12.5 | 90 days supply | Qty: 90 | Fill #0

## 2015-12-30 MED FILL — HYDROCODON-APAP 5-325: 5-325 | 3 days supply | Qty: 30 | Fill #0

## 2016-04-01 MED FILL — LEVOTHYROXINE 112 MCG TAB: 112 | 90 days supply | Qty: 90 | Fill #1

## 2016-04-01 MED FILL — LISINOPRIL-HCTZ 10-12.5 MG: 10-12.5 | 90 days supply | Qty: 90 | Fill #1

## 2016-04-09 ENCOUNTER — Other Ambulatory Visit: Payer: Self-pay | Admitting: Internal Medicine

## 2016-04-09 DIAGNOSIS — Z1231 Encounter for screening mammogram for malignant neoplasm of breast: Secondary | ICD-10-CM

## 2016-04-23 ENCOUNTER — Ambulatory Visit
Admission: RE | Admit: 2016-04-23 | Discharge: 2016-04-23 | Disposition: A | Discharge status: Home / Self Care | Payer: 59 | Source: Ambulatory Visit | Attending: Internal Medicine | Admitting: Internal Medicine

## 2016-04-23 DIAGNOSIS — Z1231 Encounter for screening mammogram for malignant neoplasm of breast: Secondary | ICD-10-CM | POA: Diagnosis not present

## 2016-06-08 ENCOUNTER — Other Ambulatory Visit: Payer: Self-pay | Admitting: Adult Health

## 2016-07-06 ENCOUNTER — Other Ambulatory Visit: Payer: Self-pay | Admitting: Internal Medicine

## 2016-07-12 MED FILL — LISINOPRIL-HCTZ 10-12.5 MG: 10-12.5 | 90 days supply | Qty: 90 | Fill #0

## 2016-07-12 MED FILL — LEVOTHYROXINE 112 MCG TAB: 112 | 90 days supply | Qty: 90 | Fill #0

## 2016-08-05 DIAGNOSIS — Z961 Presence of intraocular lens: Secondary | ICD-10-CM | POA: Diagnosis not present

## 2016-10-11 MED FILL — LEVOTHYROXINE 112 MCG TAB: 112 | 90 days supply | Qty: 90 | Fill #1

## 2016-10-11 MED FILL — LISINOPRIL-HCTZ 10-12.5 MG: 10-12.5 | 90 days supply | Qty: 90 | Fill #1

## 2016-10-27 ENCOUNTER — Encounter: Payer: Self-pay | Admitting: Gastroenterology

## 2016-12-13 ENCOUNTER — Encounter: Payer: Self-pay | Admitting: Internal Medicine

## 2016-12-13 ENCOUNTER — Ambulatory Visit (INDEPENDENT_AMBULATORY_CARE_PROVIDER_SITE_OTHER): Payer: PPO | Admitting: Internal Medicine

## 2016-12-13 VITALS — BP 136/68 | HR 60 | Temp 97.8°F | Ht 67.0 in | Wt 190.2 lb

## 2016-12-13 DIAGNOSIS — E039 Hypothyroidism, unspecified: Secondary | ICD-10-CM | POA: Diagnosis not present

## 2016-12-13 DIAGNOSIS — Z86010 Personal history of colonic polyps: Secondary | ICD-10-CM

## 2016-12-13 DIAGNOSIS — I10 Essential (primary) hypertension: Secondary | ICD-10-CM

## 2016-12-13 DIAGNOSIS — Z23 Encounter for immunization: Secondary | ICD-10-CM

## 2016-12-13 DIAGNOSIS — M549 Dorsalgia, unspecified: Secondary | ICD-10-CM

## 2016-12-13 LAB — COMPREHENSIVE METABOLIC PANEL
ALK PHOS: 71 U/L (ref 39–117)
ALT: 50 U/L — ABNORMAL HIGH (ref 0–35)
AST: 46 U/L — AB (ref 0–37)
Albumin: 4.2 g/dL (ref 3.5–5.2)
BUN: 14 mg/dL (ref 6–23)
CALCIUM: 9.6 mg/dL (ref 8.4–10.5)
CHLORIDE: 106 meq/L (ref 96–112)
CO2: 25 mEq/L (ref 19–32)
CREATININE: 0.88 mg/dL (ref 0.40–1.20)
GFR: 82.83 mL/min (ref >60.00)
Glucose, Bld: 96 mg/dL (ref 70–99)
Potassium: 4.4 mEq/L (ref 3.5–5.1)
SODIUM: 139 meq/L (ref 135–145)
TOTAL PROTEIN: 6.8 g/dL (ref 6.0–8.3)
Total Bilirubin: 1 mg/dL (ref 0.2–1.2)

## 2016-12-13 LAB — LIPID PANEL
Cholesterol: 202 mg/dL — ABNORMAL HIGH (ref 0–200)
HDL: 33 mg/dL — ABNORMAL LOW (ref >39.00)
LDL CALC: 140 mg/dL — AB (ref 0–99)
NonHDL: 168.64
TRIGLYCERIDES: 143 mg/dL (ref 0.0–149.0)
Total CHOL/HDL Ratio: 6
VLDL: 28.6 mg/dL (ref 0.0–40.0)

## 2016-12-13 LAB — CBC WITH DIFFERENTIAL/PLATELET
BASOS ABS: 0.1 10*3/uL (ref 0.0–0.1)
Basophils Relative: 1.1 % (ref 0.0–3.0)
EOS ABS: 0.2 10*3/uL (ref 0.0–0.7)
Eosinophils Relative: 3.8 % (ref 0.0–5.0)
HEMATOCRIT: 44.9 % (ref 36.0–46.0)
HEMOGLOBIN: 14.6 g/dL (ref 12.0–15.0)
LYMPHS PCT: 39.3 % (ref 12.0–46.0)
Lymphs Abs: 2 10*3/uL (ref 0.7–4.0)
MCHC: 32.5 g/dL (ref 30.0–36.0)
MCV: 89.5 fl (ref 78.0–100.0)
MONO ABS: 0.5 10*3/uL (ref 0.1–1.0)
Monocytes Relative: 9.9 % (ref 3.0–12.0)
Neutro Abs: 2.4 10*3/uL (ref 1.4–7.7)
Neutrophils Relative %: 45.9 % (ref 43.0–77.0)
Platelets: 264 10*3/uL (ref 150.0–400.0)
RBC: 5.02 Mil/uL (ref 3.87–5.11)
RDW: 12.3 % (ref 11.5–15.5)
WBC: 5.2 10*3/uL (ref 4.0–10.5)

## 2016-12-13 LAB — TSH: TSH: 0.1 u[IU]/mL — AB (ref 0.35–4.50)

## 2016-12-13 MED ORDER — HYDROCODONE-ACETAMINOPHEN 5-325 MG PO TABS: 1.0000 | ORAL_TABLET | ORAL | 0 refills | Status: DC | PRN

## 2016-12-13 MED ORDER — LEVOTHYROXINE SODIUM 88 MCG PO TABS: 88.0000 ug | ORAL_TABLET | Freq: Every day | ORAL | 3 refills | Status: DC

## 2016-12-13 MED FILL — LEVOTHYROXINE 88 MCG TABLET: 88 | 90 days supply | Qty: 90 | Fill #0

## 2016-12-13 NOTE — Progress Notes (Signed)
Pre visit review using our clinic review tool, if applicable. No additional management support is needed unless otherwise documented below in the visit note. 

## 2016-12-13 NOTE — Patient Instructions (Signed)
Limit your sodium (Salt) intake  Please check your blood pressure on a regular basis.  If it is consistently greater than 150/90, please make an office appointment.    It is important that you exercise regularly, at least 20 minutes 3 to 4 times per week.  If you develop chest pain or shortness of breath seek  medical attention.  You need to lose weight.  Consider a lower calorie diet and regular exercise.  Perform  stretching and range of motion type exercises 3 times daily as needed for upper back discomfort

## 2016-12-13 NOTE — Addendum Note (Signed)
Addended by: Abelardo Diesel on: 12/13/2016 02:16 PM   Modules accepted: Orders

## 2016-12-13 NOTE — Progress Notes (Signed)
Subjective:    Patient ID: [name], female    DOB: Aug 19, 1951, 66 y.o.   MRN: [id number]  HPI  66 year old patient who is seen today for follow-up of essential hypertension and hypothyroidism.  Her main complaint is left upper back pain.  This has been problematic for greater than 5 years in is maximal in the left infrascapular area.  She has been considering breast reduction therapy in the hopes this might alleviate this discomfort. She does use anti-inflammatory medication such as naproxen and occasional hydrocodone use. In the past.  She has been active at the gym with aerobics but not for the past few months.  At times she has resorted to a back brace to help alleviate the discomfort.   Past Medical History:  Diagnosis Date  . Headache(784.0)   . HTN (hypertension)   . Hypothyroidism   . Internal hemorrhoids   . Melanosis coli   . Tubulovillous adenoma 06/2008   1cm flat ascending colon     Social History   Social History  . Marital status: Widowed    Spouse name: N/A  . Number of children: 1  . Years of education: N/A   Occupational History  . 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 Smokeless tobacco: Never Used  . Alcohol use No  . Drug use: No  . Sexual activity: Yes   Other Topics Concern  . Not on file   Social History Narrative  . No narrative on file    Past Surgical History:  Procedure Laterality Date  . CATARACT EXTRACTION  2011   bilateral  . COLONOSCOPY  06/04/08   moderate internal hemorrhoids/melanosis coli/tubulovillous adenoma 1cm flat  . COLONOSCOPY  12/17/2011   Procedure: COLONOSCOPY;  Surgeon: Dorothyann Peng, MD;  [facility];  Service: Endoscopy;  Laterality: N/A;  12:30  . DIAGNOSTIC MAMMOGRAM    . TUBAL LIGATION  1977  . VAGINAL DELIVERY      Family History  Problem Relation Age of Onset  . Colon cancer Neg Hx   . Lung cancer Neg Hx   . Ovarian cancer Neg  Hx   . Breast cancer Sister     two, one deceased  . Cancer Sister     breast ca  . Pancreatic cancer Mother     deceased, 24  . Heart disease Mother   . Hypertension Mother     No Known Allergies  Current Outpatient Prescriptions on File Prior to Visit  Medication Sig Dispense Refill  . levothyroxine (SYNTHROID, LEVOTHROID) 112 MCG tablet TAKE 1 TABLET BY MOUTH DAILY. 90 tablet 1  . lisinopril-hydrochlorothiazide (PRINZIDE,ZESTORETIC) 10-12.5 MG tablet TAKE 1 TABLET BY MOUTH DAILY. 90 tablet 1   No current facility-administered medications on file prior to visit.     BP 136/68 (BP Location: Left Arm, Patient Position: Sitting, Cuff Size: Normal)   Pulse 60   Temp 97.8 F (36.6 C) (Oral)   Ht 5\' 7"  (1.702 m)   Wt 190 lb 3.2 oz (86.3 kg)   SpO2 97%   BMI 29.79 kg/m     Review of Systems  Constitutional: Negative.   HENT: Negative for congestion, dental problem, hearing loss, rhinorrhea, sinus pressure, sore throat and tinnitus.   Eyes: Negative for pain, discharge and visual disturbance.  Respiratory: Negative for cough and shortness of breath.   Cardiovascular: Negative for chest pain, palpitations and leg swelling.  Gastrointestinal: Negative  for abdominal distention, abdominal pain, blood in stool, constipation, diarrhea, nausea and vomiting.  Genitourinary: Negative for difficulty urinating, dysuria, flank pain, frequency, hematuria, pelvic pain, urgency, vaginal bleeding, vaginal discharge and vaginal pain.  Musculoskeletal: Positive for back pain. Negative for arthralgias, gait problem and joint swelling.  Skin: Negative for rash.  Neurological: Negative for dizziness, syncope, speech difficulty, weakness, numbness and headaches.  Hematological: Negative for adenopathy.  Psychiatric/Behavioral: Negative for agitation, behavioral problems and dysphoric mood. The patient is not nervous/anxious.        Objective:   Physical Exam  Constitutional: She is oriented to  person, place, and time. She appears well-developed and well-nourished.  Weight 190 Blood pressure low normal  HENT:  Head: Normocephalic.  Right Ear: External ear normal.  Left Ear: External ear normal.  Mouth/Throat: Oropharynx is clear and moist.  Eyes: Conjunctivae and EOM are normal. Pupils are equal, round, and reactive to light.  Neck: Normal range of motion. Neck supple. No thyromegaly present.  Cardiovascular: Normal rate, regular rhythm, normal heart sounds and intact distal pulses.   Pulmonary/Chest: Effort normal and breath sounds normal.  Abdominal: Soft. Bowel sounds are normal. She exhibits no mass. There is no tenderness.  Musculoskeletal: Normal range of motion.  No focal tenderness about the scapular area on the left  Lymphadenopathy:    She has no cervical adenopathy.  Neurological: She is alert and oriented to person, place, and time.  Skin: Skin is warm and dry. No rash noted.  Psychiatric: She has a normal mood and affect. Her behavior is normal.          Assessment & Plan:   Chronic left upper back pain.  Will continue naproxen as necessary.  The patient will try stretching and range of motion exercises.  She will check with her insurance plan.  About coverage for physical therapy Essential hypertension, stable Hypothyroidism  We'll check updated lab including TSH Follow-up 6 months  Jesten Cappuccio Pilar Plate

## 2016-12-13 NOTE — Addendum Note (Signed)
Addended by: Abelardo Diesel on: 12/13/2016 04:57 PM   Modules accepted: Orders

## 2016-12-16 MED FILL — HYDROCODON-APAP 5-325: 5-325 | 3 days supply | Qty: 30 | Fill #0

## 2017-01-12 ENCOUNTER — Other Ambulatory Visit: Payer: Self-pay | Admitting: Internal Medicine

## 2017-01-12 MED FILL — LISINOPRIL-HCTZ 10-12.5 MG: 10-12.5 | 90 days supply | Qty: 90 | Fill #0

## 2017-02-01 ENCOUNTER — Encounter: Payer: Self-pay | Admitting: Internal Medicine

## 2017-02-01 ENCOUNTER — Ambulatory Visit (INDEPENDENT_AMBULATORY_CARE_PROVIDER_SITE_OTHER): Payer: PPO | Admitting: Internal Medicine

## 2017-02-01 VITALS — BP 108/62 | HR 63 | Temp 97.6°F | Ht 67.0 in | Wt 194.0 lb

## 2017-02-01 DIAGNOSIS — I10 Essential (primary) hypertension: Secondary | ICD-10-CM

## 2017-02-01 DIAGNOSIS — E039 Hypothyroidism, unspecified: Secondary | ICD-10-CM | POA: Diagnosis not present

## 2017-02-01 LAB — TSH: TSH: 0.5 u[IU]/mL (ref 0.35–4.50)

## 2017-02-01 NOTE — Progress Notes (Signed)
Pre visit review using our clinic review tool, if applicable. No additional management support is needed unless otherwise documented below in the visit note. 

## 2017-02-01 NOTE — Patient Instructions (Signed)

## 2017-02-01 NOTE — Progress Notes (Signed)
Subjective:    Patient ID: [name], female    DOB: July 22, 1951, 66 y.o.   MRN: [id number]  HPI  66 year old patient who has essential hypertension and hypothyroidism.  She was seen approximately 2 months ago and TSH was suppressed.  Levothyroxin.  Dosing was down titrated.  She generally feels well. Blood pressure days in a low-normal range  No new concerns or complaints  Past Medical History:  Diagnosis Date  . Headache(784.0)   . HTN (hypertension)   . Hypothyroidism   . Internal hemorrhoids   . Melanosis coli   . Tubulovillous adenoma 06/2008   1cm flat ascending colon     Social History   Social History  . Marital status: Widowed    Spouse name: N/A  . Number of children: 1  . Years of education: N/A   Occupational History  . 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 Smokeless tobacco: Never Used  . Alcohol use No  . Drug use: No  . Sexual activity: Yes   Other Topics Concern  . Not on file   Social History Narrative  . No narrative on file    Past Surgical History:  Procedure Laterality Date  . CATARACT EXTRACTION  2011   bilateral  . COLONOSCOPY  06/04/08   moderate internal hemorrhoids/melanosis coli/tubulovillous adenoma 1cm flat  . COLONOSCOPY  12/17/2011   Procedure: COLONOSCOPY;  Surgeon: Dorothyann Peng, MD;  [facility];  Service: Endoscopy;  Laterality: N/A;  12:30  . DIAGNOSTIC MAMMOGRAM    . TUBAL LIGATION  1977  . VAGINAL DELIVERY      Family History  Problem Relation Age of Onset  . Colon cancer Neg Hx   . Lung cancer Neg Hx   . Ovarian cancer Neg Hx   . Breast cancer Sister     two, one deceased  . Cancer Sister     breast ca  . Pancreatic cancer Mother     deceased, 23  . Heart disease Mother   . Hypertension Mother     No Known Allergies  Current Outpatient Prescriptions on File Prior to Visit  Medication Sig Dispense Refill  .  HYDROcodone-acetaminophen (NORCO/VICODIN) 5-325 MG tablet Take 1-2 tablets by mouth every 4 (four) hours as needed. 30 tablet 0  . levothyroxine (SYNTHROID, LEVOTHROID) 88 MCG tablet Take 1 tablet (88 mcg total) by mouth daily. 90 tablet 3  . lisinopril-hydrochlorothiazide (PRINZIDE,ZESTORETIC) 10-12.5 MG tablet TAKE 1 TABLET BY MOUTH DAILY. 90 tablet 1   No current facility-administered medications on file prior to visit.     BP 108/62 (BP Location: Right Arm, Patient Position: Sitting, Cuff Size: Normal)   Pulse 63   Temp 97.6 F (36.4 C) (Oral)   Ht 5\' 7"  (1.702 m)   Wt 194 lb (88 kg)   SpO2 98%   BMI 30.38 kg/m    Review of Systems  Constitutional: Negative.   HENT: Negative for congestion, dental problem, hearing loss, rhinorrhea, sinus pressure, sore throat and tinnitus.   Eyes: Negative for pain, discharge and visual disturbance.  Respiratory: Negative for cough and shortness of breath.   Cardiovascular: Negative for chest pain, palpitations and leg swelling.  Gastrointestinal: Negative for abdominal distention, abdominal pain, blood in stool, constipation, diarrhea, nausea and vomiting.  Genitourinary: Negative for difficulty urinating, dysuria, flank pain, frequency, hematuria, pelvic pain, urgency, vaginal bleeding, vaginal discharge and vaginal pain.  Musculoskeletal: Negative for  arthralgias, gait problem and joint swelling.  Skin: Negative for rash.  Neurological: Negative for dizziness, syncope, speech difficulty, weakness, numbness and headaches.  Hematological: Negative for adenopathy.  Psychiatric/Behavioral: Negative for agitation, behavioral problems and dysphoric mood. The patient is not nervous/anxious.        Objective:   Physical Exam  Constitutional: She appears well-developed and well-nourished. No distress.  Blood pressure 110/70          Assessment & Plan:   Essential hypertension.  Excellent control Hypothyroidism.  TSH was recently  suppressed and levothyroxine dose down titrated.  We'll recheck a TSH.  Annual exam one year  [name]

## 2017-03-15 MED FILL — LEVOTHYROXINE 88 MCG TABLET: 88 | 90 days supply | Qty: 90 | Fill #1

## 2017-04-15 ENCOUNTER — Ambulatory Visit (INDEPENDENT_AMBULATORY_CARE_PROVIDER_SITE_OTHER): Payer: PPO | Admitting: Internal Medicine

## 2017-04-15 ENCOUNTER — Encounter: Payer: Self-pay | Admitting: Internal Medicine

## 2017-04-15 VITALS — BP 110/68 | HR 64 | Temp 98.3°F | Wt 191.2 lb

## 2017-04-15 DIAGNOSIS — L723 Sebaceous cyst: Secondary | ICD-10-CM | POA: Diagnosis not present

## 2017-04-15 DIAGNOSIS — I10 Essential (primary) hypertension: Secondary | ICD-10-CM | POA: Diagnosis not present

## 2017-04-15 MED FILL — LISINOPRIL-HCTZ 10-12.5 MG: 10-12.5 | 90 days supply | Qty: 90 | Fill #1

## 2017-04-15 NOTE — Patient Instructions (Addendum)
Epidermal Cyst An epidermal cyst is sometimes called an epidermal inclusion cyst or an infundibular cyst. It is a sac made of skin tissue. The sac contains a substance called keratin. Keratin is a protein that is normally secreted through the hair follicles. When keratin becomes trapped in the top layer of skin (epidermis), it can form an epidermal cyst. Epidermal cysts are usually found on the face, neck, trunk, and genitals. These cysts are usually harmless (benign), and they may not cause symptoms unless they become infected. It is important not to pop epidermal cysts yourself. What are the causes? This condition may be caused by:  A blocked hair follicle.  A hair that curls and re-enters the skin instead of growing straight out of the skin (ingrown hair).  A blocked pore.  Irritated skin.  An injury to the skin.  Certain conditions that are passed along from parent to child (inherited).  Human papillomavirus (HPV).  What increases the risk? The following factors may make you more likely to develop an epidermal cyst:  Having acne.  Being overweight.  Wearing tight clothing.  What are the signs or symptoms? The only symptom of this condition may be a small, painless lump underneath the skin. When an epidermal cyst becomes infected, symptoms may include:  Redness.  Inflammation.  Tenderness.  Warmth.  Fever.  Keratin draining from the cyst. Keratin may look like a grayish-white, bad-smelling substance.  Pus draining from the cyst.  How is this diagnosed? This condition is diagnosed with a physical exam. In some cases, you may have a sample of tissue (biopsy) taken from your cyst to be examined under a microscope or tested for bacteria. You may be referred to a health care provider who specializes in skin care (dermatologist). How is this treated? In many cases, epidermal cysts go away on their own without treatment. If a cyst becomes infected, treatment may  include:  Opening and draining the cyst. After draining, minor surgery to remove the rest of the cyst may be done.  Antibiotic medicine to help prevent infection.  Injections of medicines (steroids) that help to reduce inflammation.  Surgery to remove the cyst. Surgery may be done if: ? The cyst becomes large. ? The cyst bothers you. ? There is a chance that the cyst could turn into cancer.  Follow these instructions at home:  Take over-the-counter and prescription medicines only as told by your health care provider.  If you were prescribed an antibiotic, use it as told by your health care provider. Do not stop using the antibiotic even if you start to feel better.  Keep the area around your cyst clean and dry.  Wear loose, dry clothing.  Do not try to pop your cyst.  Avoid touching your cyst.  Check your cyst every day for signs of infection.  Keep all follow-up visits as told by your health care provider. This is important. How is this prevented?  Wear clean, dry, clothing.  Avoid wearing tight clothing.  Keep your skin clean and dry. Shower or take baths every day.  Wash your body with a benzoyl peroxide wash when you shower or bathe. Contact a health care provider if:  Your cyst develops symptoms of infection.  Your condition is not improving or is getting worse.  You develop a cyst that looks different from other cysts you have had.  You have a fever. Get help right away if:  Redness spreads from the cyst into the surrounding area. This information is   not intended to replace advice given to you by your health care provider. Make sure you discuss any questions you have with your health care provider. Document Released: 09/18/2004 Document Revised: 06/16/2016 Document Reviewed: 08/20/2015 Elsevier Interactive Patient Education  2018 Elsevier Inc.  

## 2017-04-15 NOTE — Progress Notes (Signed)
Subjective:    Patient ID: [name], female    DOB: November 05, 1950, 66 y.o.   MRN: [id number]  HPI  66 year old patient who is seen today to evaluate a painful nodule involving the right upper back area. 5 days ago, this nodule was quite red and painful and much larger.  Throughout the week, it has decreased in size and pain and presently there is no discomfort She has essential hypertension which has been stable  Past Medical History:  Diagnosis Date  . Headache(784.0)   . HTN (hypertension)   . Hypothyroidism   . Internal hemorrhoids   . Melanosis coli   . Tubulovillous adenoma 06/2008   1cm flat ascending colon     Social History   Social History  . Marital status: Widowed    Spouse name: N/A  . Number of children: 1  . Years of education: N/A   Occupational History  . 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 Smoking status: Never Smoker  . Smokeless tobacco: Never Used  . Alcohol use No  . Drug use: No  . Sexual activity: Yes   Other Topics Concern  . Not on file   Social History Narrative  . No narrative on file    Past Surgical History:  Procedure Laterality Date  . CATARACT EXTRACTION  2011   bilateral  . COLONOSCOPY  06/04/08   moderate internal hemorrhoids/melanosis coli/tubulovillous adenoma 1cm flat  . COLONOSCOPY  12/17/2011   Procedure: COLONOSCOPY;  Surgeon: Dorothyann Peng, MD;  Location: [facility];  Service: Endoscopy;  Laterality: N/A;  12:30  . DIAGNOSTIC MAMMOGRAM    . TUBAL LIGATION  1977  . VAGINAL DELIVERY      Family History  Problem Relation Age of Onset  . Colon cancer Neg Hx   . Lung cancer Neg Hx   . Ovarian cancer Neg Hx   . Breast cancer Sister        two, one deceased  . Cancer Sister        breast ca  . Pancreatic cancer Mother        deceased, 16  . Heart disease Mother   . Hypertension Mother     No Known Allergies  Current Outpatient Prescriptions on File Prior to Visit  Medication  Sig Dispense Refill  . HYDROcodone-acetaminophen (NORCO/VICODIN) 5-325 MG tablet Take 1-2 tablets by mouth every 4 (four) hours as needed. 30 tablet 0  . levothyroxine (SYNTHROID, LEVOTHROID) 88 MCG tablet Take 1 tablet (88 mcg total) by mouth daily. 90 tablet 3  . lisinopril-hydrochlorothiazide (PRINZIDE,ZESTORETIC) 10-12.5 MG tablet TAKE 1 TABLET BY MOUTH DAILY. 90 tablet 1   No current facility-administered medications on file prior to visit.     BP 110/68 (BP Location: Left Arm, Patient Position: Sitting, Cuff Size: Normal)   Pulse 64   Temp 98.3 F (36.8 C) (Oral)   Wt 191 lb 3.2 oz (86.7 kg)   SpO2 96%   BMI 29.95 kg/m     Review of Systems  Constitutional: Negative.  Negative for appetite change, fatigue, fever and unexpected weight change.  HENT: Negative for congestion, dental problem, ear pain, hearing loss, mouth sores, nosebleeds, rhinorrhea, sinus pressure, sore throat, tinnitus, trouble swallowing and voice change.   Eyes: Negative for photophobia, pain, discharge, redness and visual disturbance.  Respiratory: Negative for cough, chest tightness and shortness of breath.   Cardiovascular: Negative for chest pain, palpitations and leg swelling.  Gastrointestinal: Negative  for abdominal distention, abdominal pain, blood in stool, constipation, diarrhea, nausea, rectal pain and vomiting.  Genitourinary: Negative for difficulty urinating, dysuria, flank pain, frequency, genital sores, hematuria, menstrual problem, pelvic pain, urgency, vaginal bleeding, vaginal discharge and vaginal pain.  Musculoskeletal: Negative for arthralgias, back pain, gait problem, joint swelling and neck stiffness.  Skin: Positive for wound. Negative for rash.  Neurological: Negative for dizziness, syncope, speech difficulty, weakness, light-headedness, numbness and headaches.  Hematological: Negative for adenopathy. Does not bruise/bleed easily.  Psychiatric/Behavioral: Negative for agitation,  behavioral problems, dysphoric mood, self-injury and suicidal ideas. The patient is not nervous/anxious.        Objective:   Physical Exam  Constitutional: She appears well-developed and well-nourished. No distress.  Blood pressure 110/70  Skin:  2 x 2 centimeter subcutaneous nodule, right upper back area No significant tenderness or erythema          Assessment & Plan:   Sebaceous cyst, right upper back. No inflammatory changes at this time.  Options were discussed including observation or incision and drainage.  Will observe at this time since now asymptomatic Essential hypertension, stable  [name]

## 2017-05-27 ENCOUNTER — Other Ambulatory Visit: Payer: Self-pay | Admitting: Internal Medicine

## 2017-05-27 DIAGNOSIS — Z1231 Encounter for screening mammogram for malignant neoplasm of breast: Secondary | ICD-10-CM

## 2017-06-07 ENCOUNTER — Ambulatory Visit
Admission: RE | Admit: 2017-06-07 | Discharge: 2017-06-07 | Disposition: A | Discharge status: Home / Self Care | Payer: PPO | Source: Ambulatory Visit | Attending: Internal Medicine | Admitting: Internal Medicine

## 2017-06-07 DIAGNOSIS — Z1231 Encounter for screening mammogram for malignant neoplasm of breast: Secondary | ICD-10-CM | POA: Diagnosis not present

## 2017-06-22 MED FILL — LEVOTHYROXINE 88 MCG TABLET: 88 | 90 days supply | Qty: 90 | Fill #2

## 2017-07-27 ENCOUNTER — Other Ambulatory Visit: Payer: Self-pay | Admitting: Internal Medicine

## 2017-07-27 MED FILL — LISINOPRIL-HCTZ 10-12.5 MG: 10-12.5 | 90 days supply | Qty: 90 | Fill #0

## 2017-09-16 DIAGNOSIS — H04123 Dry eye syndrome of bilateral lacrimal glands: Secondary | ICD-10-CM | POA: Diagnosis not present

## 2017-09-16 DIAGNOSIS — H524 Presbyopia: Secondary | ICD-10-CM | POA: Diagnosis not present

## 2017-09-16 DIAGNOSIS — H538 Other visual disturbances: Secondary | ICD-10-CM | POA: Diagnosis not present

## 2017-09-16 DIAGNOSIS — Z961 Presence of intraocular lens: Secondary | ICD-10-CM | POA: Diagnosis not present

## 2017-10-03 MED FILL — LEVOTHYROXINE 88 MCG TABLET: 88 | 90 days supply | Qty: 90 | Fill #3

## 2017-11-07 ENCOUNTER — Encounter: Payer: Self-pay | Admitting: Internal Medicine

## 2017-11-07 ENCOUNTER — Ambulatory Visit (INDEPENDENT_AMBULATORY_CARE_PROVIDER_SITE_OTHER): Payer: PPO | Admitting: Internal Medicine

## 2017-11-07 VITALS — BP 128/64 | HR 76 | Temp 98.0°F | Ht 67.0 in | Wt 186.8 lb

## 2017-11-07 DIAGNOSIS — I10 Essential (primary) hypertension: Secondary | ICD-10-CM

## 2017-11-07 DIAGNOSIS — E039 Hypothyroidism, unspecified: Secondary | ICD-10-CM

## 2017-11-07 MED ORDER — LISINOPRIL-HYDROCHLOROTHIAZIDE 10-12.5 MG PO TABS: 1.0000 | ORAL_TABLET | Freq: Every day | ORAL | 3 refills | Status: DC

## 2017-11-07 MED ORDER — LEVOTHYROXINE SODIUM 88 MCG PO TABS: 88.0000 ug | ORAL_TABLET | Freq: Every day | ORAL | 3 refills | Status: DC

## 2017-11-07 MED FILL — LISINOPRIL-HCTZ 10-12.5 MG: 10-12.5 | 90 days supply | Qty: 90 | Fill #0

## 2017-11-07 NOTE — Progress Notes (Signed)
Subjective:    Patient ID: [name], female    DOB: [birth year], 67 y.o.   MRN: [id number]  HPI 67 year old patient seen today for follow-up.  She has essential hypertension and hypothyroidism. Doing quite well. She works a third shift. No new concerns or complaints.  Past Medical History:  Diagnosis Date  . Headache(784.0)   . HTN (hypertension)   . Hypothyroidism   . Internal hemorrhoids   . Melanosis coli   . Tubulovillous adenoma 06/2008   1cm flat ascending colon     Social History   Socioeconomic History  . Marital status: Widowed    Spouse name: Not on file  . Number of children: 1  . Years of education: Not on file  . Highest education level: Not on file  Social Needs  . Financial resource strain: Not on file  . Food insecurity - worry: Not on file  . Food insecurity - inability: Not on file  . Transportation needs - medical: Not on file  . Transportation needs - non-medical: Not on file  Occupational History  . Occupation: Research scientist (physical sciences): [employer]  Tobacco Use  . Smoking status: Never Smoker  . Smokeless tobacco: Never Used  Substance and Sexual Activity  . Alcohol use: No  . Drug use: No  . Sexual activity: Yes  Other Topics Concern  . Not on file  Social History Narrative  . Not on file    Past Surgical History:  Procedure Laterality Date  . CATARACT EXTRACTION  2011   bilateral  . COLONOSCOPY  06/04/08   moderate internal hemorrhoids/melanosis coli/tubulovillous adenoma 1cm flat  . COLONOSCOPY  12/17/2011   Procedure: COLONOSCOPY;  Surgeon: Dorothyann Peng, MD;  Location: [facility];  Service: Endoscopy;  Laterality: N/A;  12:30  . DIAGNOSTIC MAMMOGRAM    . TUBAL LIGATION  1977  . VAGINAL DELIVERY      Family History  Problem Relation Age of Onset  . Breast cancer Sister        two, one deceased  . Cancer Sister        breast ca  . Pancreatic cancer Mother        deceased, 69  . Heart disease Mother   .  Hypertension Mother   . Colon cancer Neg Hx   . Lung cancer Neg Hx   . Ovarian cancer Neg Hx     No Known Allergies  Current Outpatient Medications on File Prior to Visit  Medication Sig Dispense Refill  . HYDROcodone-acetaminophen (NORCO/VICODIN) 5-325 MG tablet Take 1-2 tablets by mouth every 4 (four) hours as needed. 30 tablet 0  . levothyroxine (SYNTHROID, LEVOTHROID) 88 MCG tablet Take 1 tablet (88 mcg total) by mouth daily. 90 tablet 3  . lisinopril-hydrochlorothiazide (PRINZIDE,ZESTORETIC) 10-12.5 MG tablet TAKE 1 TABLET BY MOUTH DAILY. 90 tablet 1   No current facility-administered medications on file prior to visit.     BP 128/64 (BP Location: Left Arm, Patient Position: Sitting, Cuff Size: Normal)   Pulse 76   Temp 98 F (36.7 C) (Oral)   Ht 5\' 7"  (1.702 m)   Wt 186 lb 12.8 oz (84.7 kg)   SpO2 97%   BMI 29.26 kg/m      Review of Systems  Constitutional: Negative.   HENT: Negative for congestion, dental problem, hearing loss, rhinorrhea, sinus pressure, sore throat and tinnitus.   Eyes: Negative for pain, discharge and visual disturbance.  Respiratory: Negative for cough and  shortness of breath.   Cardiovascular: Negative for chest pain, palpitations and leg swelling.  Gastrointestinal: Negative for abdominal distention, abdominal pain, blood in stool, constipation, diarrhea, nausea and vomiting.  Genitourinary: Negative for difficulty urinating, dysuria, flank pain, frequency, hematuria, pelvic pain, urgency, vaginal bleeding, vaginal discharge and vaginal pain.  Musculoskeletal: Negative for arthralgias, gait problem and joint swelling.  Skin: Negative for rash.  Neurological: Negative for dizziness, syncope, speech difficulty, weakness, numbness and headaches.  Hematological: Negative for adenopathy.  Psychiatric/Behavioral: Negative for agitation, behavioral problems and dysphoric mood. The patient is not nervous/anxious.        Objective:   Physical Exam    Constitutional: She is oriented to person, place, and time. She appears well-developed and well-nourished.  HENT:  Head: Normocephalic.  Right Ear: External ear normal.  Left Ear: External ear normal.  Mouth/Throat: Oropharynx is clear and moist.  Eyes: Conjunctivae and EOM are normal. Pupils are equal, round, and reactive to light.  Neck: Normal range of motion. Neck supple. No thyromegaly present.  Cardiovascular: Normal rate, regular rhythm, normal heart sounds and intact distal pulses.  Pulmonary/Chest: Effort normal and breath sounds normal.  Abdominal: Soft. Bowel sounds are normal. She exhibits no mass. There is no tenderness.  Musculoskeletal: Normal range of motion.  Lymphadenopathy:    She has no cervical adenopathy.  Neurological: She is alert and oriented to person, place, and time.  Skin: Skin is warm and dry. No rash noted.  Psychiatric: She has a normal mood and affect. Her behavior is normal.          Assessment & Plan:  Essential hypertension well-controlled  Mild obesity.  Improved.  Continue efforts at weight loss Mild dyslipidemia Hypothyroidism.  Medications updated Schedule CPX for 4 months  Keryn Nessler Pilar Plate

## 2017-11-07 NOTE — Patient Instructions (Signed)
Limit your sodium (Salt) intake    It is important that you exercise regularly, at least 20 minutes 3 to 4 times per week.  If you develop chest pain or shortness of breath seek  medical attention.  Please check your blood pressure on a regular basis.  If it is consistently greater than 150/90, please make an office appointment.  Return in 4 months for follow-up  

## 2018-01-16 MED FILL — LEVOTHYROXINE 88 MCG TABLET: 88 | 90 days supply | Qty: 90 | Fill #0

## 2018-02-20 MED FILL — LISINOPRIL-HCTZ 10-12.5 MG: 10-12.5 | 90 days supply | Qty: 90 | Fill #1

## 2018-04-27 MED FILL — LEVOTHYROXINE 88 MCG TABLET: 88 | 90 days supply | Qty: 90 | Fill #1

## 2018-05-24 MED FILL — LISINOPRIL-HCTZ 10-12.5 TAB: 10-12.5 | 90 days supply | Qty: 90 | Fill #2

## 2018-07-07 ENCOUNTER — Ambulatory Visit (HOSPITAL_COMMUNITY)
Admission: RE | Admit: 2018-07-07 | Discharge: 2018-07-07 | Disposition: A | Discharge status: Home / Self Care | Payer: PPO | Source: Ambulatory Visit | Attending: Internal Medicine | Admitting: Internal Medicine

## 2018-07-07 ENCOUNTER — Other Ambulatory Visit: Payer: Self-pay | Admitting: Internal Medicine

## 2018-07-07 DIAGNOSIS — Z1231 Encounter for screening mammogram for malignant neoplasm of breast: Secondary | ICD-10-CM

## 2018-07-25 MED FILL — LEVOTHYROXINE 88 MCG TABLET: 88 | 90 days supply | Qty: 90 | Fill #2

## 2018-08-23 MED FILL — LISINOPRIL-HCTZ 10-12.5 TAB: 10-12.5 | 90 days supply | Qty: 90 | Fill #3

## 2018-08-29 ENCOUNTER — Other Ambulatory Visit: Payer: Self-pay | Admitting: Adult Health

## 2018-09-05 ENCOUNTER — Ambulatory Visit: Payer: PPO | Admitting: Adult Health

## 2018-09-05 ENCOUNTER — Encounter (INDEPENDENT_AMBULATORY_CARE_PROVIDER_SITE_OTHER): Payer: Self-pay

## 2018-09-05 ENCOUNTER — Other Ambulatory Visit (HOSPITAL_COMMUNITY)
Admission: RE | Admit: 2018-09-05 | Discharge: 2018-09-05 | Disposition: A | Discharge status: Home / Self Care | Payer: PPO | Source: Ambulatory Visit | Attending: Adult Health | Admitting: Adult Health

## 2018-09-05 ENCOUNTER — Encounter: Payer: Self-pay | Admitting: Adult Health

## 2018-09-05 VITALS — BP 126/77 | HR 68 | Ht 66.0 in | Wt 167.0 lb

## 2018-09-05 DIAGNOSIS — Z1211 Encounter for screening for malignant neoplasm of colon: Secondary | ICD-10-CM

## 2018-09-05 DIAGNOSIS — Z124 Encounter for screening for malignant neoplasm of cervix: Secondary | ICD-10-CM | POA: Insufficient documentation

## 2018-09-05 DIAGNOSIS — Z1212 Encounter for screening for malignant neoplasm of rectum: Secondary | ICD-10-CM

## 2018-09-05 DIAGNOSIS — Z01419 Encounter for gynecological examination (general) (routine) without abnormal findings: Secondary | ICD-10-CM | POA: Insufficient documentation

## 2018-09-05 LAB — HEMOCCULT GUIAC POC 1CARD (OFFICE): Fecal Occult Blood, POC: NEGATIVE

## 2018-09-05 NOTE — Progress Notes (Signed)
Patient ID: [name], female   DOB: July 28, 1951, 67 y.o.   MRN: [id number] History of Present Illness: [name] is a 67 year old black female, widowed, PM in for well woman gyn exam and pap. 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 Her son lives with her.   PCP was Dr Burnice Logan, but he retired, and she is looking.    Current Medications, Allergies, Past Medical History, Past Surgical History, Family History and Social History were reviewed in [facility] record.     Review of Systems: Patient denies any headaches, hearing loss, fatigue, blurred vision, shortness of breath, chest pain, abdominal pain, problems with bowel movements,(has BM about 3 x weekly), urination, or intercourse. No joint pain or mood swings.    Physical Exam:BP 126/77 (BP Location: Left Arm, Patient Position: Sitting, Cuff Size: Normal)   Pulse 68   Ht 5\' 6"  (1.676 m)   Wt 167 lb (75.8 kg)   BMI 26.95 kg/m  General:  Well developed, well nourished, no acute distress Skin:  Warm and dry Neck:  Midline trachea, normal thyroid, good ROM, no lymphadenopathy,no carotid bruits heard  Lungs; Clear to auscultation bilaterally Breast:  No dominant palpable mass, retraction, or nipple discharge Cardiovascular: Regular rate and rhythm Abdomen:  Soft, non tender, no hepatosplenomegaly Pelvic:  External genitalia is normal in appearance, no lesions.  The vagina is normal in appearance for age with loss of color, moisture and rugae. Urethra has no lesions or masses. The cervix is smooth, pap with HPV performed.  Uterus is felt to be normal size, shape, and contour.  No adnexal masses or tenderness noted.Bladder is non tender, no masses felt. Rectal: Good sphincter tone, no polyps, or hemorrhoids felt.  Hemoccult negative. Extremities/musculoskeletal:  No swelling or varicosities noted, no clubbing or cyanosis Psych:  No mood changes, alert and  cooperative,seems happy PHQ 2 score 0. Examination chaperoned by Levy Pupa LPN.   Impression:  1. Encounter for gynecological examination with Papanicolaou smear of cervix   2. Routine cervical smear   3. Screening for colorectal cancer      Plan: Pap and pelvic in 3 years, if normal Mammogram yearly Colonoscopy with GI  Labs with PCP

## 2018-09-06 LAB — CYTOLOGY - PAP
Diagnosis: NEGATIVE
HPV: NOT DETECTED

## 2018-09-18 DIAGNOSIS — H524 Presbyopia: Secondary | ICD-10-CM | POA: Diagnosis not present

## 2018-09-18 DIAGNOSIS — H52202 Unspecified astigmatism, left eye: Secondary | ICD-10-CM | POA: Diagnosis not present

## 2018-09-18 DIAGNOSIS — Z961 Presence of intraocular lens: Secondary | ICD-10-CM | POA: Diagnosis not present

## 2018-09-18 DIAGNOSIS — I10 Essential (primary) hypertension: Secondary | ICD-10-CM | POA: Diagnosis not present

## 2018-10-27 MED FILL — LEVOTHYROXINE 88 MCG TABLET: 88 | 90 days supply | Qty: 90 | Fill #3

## 2018-11-28 ENCOUNTER — Other Ambulatory Visit: Payer: Self-pay | Admitting: Internal Medicine

## 2018-12-01 MED FILL — LISINOPRIL-HCTZ 10-12.5 MG: 10-12.5 | 90 days supply | Qty: 90 | Fill #0

## 2018-12-01 NOTE — Telephone Encounter (Signed)
Pt called to f/u on request for lisinopril/hctz. She has taken last dose today and requested on 1/28. I have scheduled pt for TOC to Dr. Martinique 12/20/2018. Please advise on refill.  [place], [place]

## 2018-12-01 NOTE — Telephone Encounter (Signed)
Pt changed appt to 01/01/2019 as she forgot about a bible study 2/19.

## 2018-12-20 ENCOUNTER — Encounter: Payer: PPO | Admitting: Family Medicine

## 2019-01-01 ENCOUNTER — Ambulatory Visit (INDEPENDENT_AMBULATORY_CARE_PROVIDER_SITE_OTHER): Payer: PPO | Admitting: Family Medicine

## 2019-01-01 ENCOUNTER — Encounter: Payer: Self-pay | Admitting: Family Medicine

## 2019-01-01 VITALS — BP 110/74 | HR 61 | Temp 98.4°F | Resp 12 | Ht 66.5 in | Wt 171.0 lb

## 2019-01-01 DIAGNOSIS — Z0000 Encounter for general adult medical examination without abnormal findings: Secondary | ICD-10-CM

## 2019-01-01 DIAGNOSIS — R740 Nonspecific elevation of levels of transaminase and lactic acid dehydrogenase [LDH]: Secondary | ICD-10-CM

## 2019-01-01 DIAGNOSIS — I10 Essential (primary) hypertension: Secondary | ICD-10-CM | POA: Diagnosis not present

## 2019-01-01 DIAGNOSIS — Z23 Encounter for immunization: Secondary | ICD-10-CM | POA: Diagnosis not present

## 2019-01-01 DIAGNOSIS — E785 Hyperlipidemia, unspecified: Secondary | ICD-10-CM

## 2019-01-01 DIAGNOSIS — E039 Hypothyroidism, unspecified: Secondary | ICD-10-CM

## 2019-01-01 DIAGNOSIS — Z1159 Encounter for screening for other viral diseases: Secondary | ICD-10-CM

## 2019-01-01 DIAGNOSIS — Z9189 Other specified personal risk factors, not elsewhere classified: Secondary | ICD-10-CM

## 2019-01-01 DIAGNOSIS — R7401 Elevation of levels of liver transaminase levels: Secondary | ICD-10-CM

## 2019-01-01 LAB — COMPREHENSIVE METABOLIC PANEL
ALT: 63 U/L — ABNORMAL HIGH (ref 0–35)
AST: 57 U/L — AB (ref 0–37)
Albumin: 4.4 g/dL (ref 3.5–5.2)
Alkaline Phosphatase: 67 U/L (ref 39–117)
BUN: 18 mg/dL (ref 6–23)
CALCIUM: 9.9 mg/dL (ref 8.4–10.5)
CHLORIDE: 102 meq/L (ref 96–112)
CO2: 30 meq/L (ref 19–32)
Creatinine, Ser: 1.02 mg/dL (ref 0.40–1.20)
GFR: 65.31 mL/min (ref >60.00)
Glucose, Bld: 93 mg/dL (ref 70–99)
Potassium: 4.5 mEq/L (ref 3.5–5.1)
Sodium: 139 mEq/L (ref 135–145)
Total Bilirubin: 1.2 mg/dL (ref 0.2–1.2)
Total Protein: 7.2 g/dL (ref 6.0–8.3)

## 2019-01-01 LAB — LIPID PANEL
CHOL/HDL RATIO: 5
CHOLESTEROL: 191 mg/dL (ref 0–200)
HDL: 35.5 mg/dL — ABNORMAL LOW (ref >39.00)
NonHDL: 155.62
TRIGLYCERIDES: 213 mg/dL — AB (ref 0.0–149.0)
VLDL: 42.6 mg/dL — AB (ref 0.0–40.0)

## 2019-01-01 LAB — LDL CHOLESTEROL, DIRECT: Direct LDL: 136 mg/dL

## 2019-01-01 LAB — TSH: TSH: 0.39 u[IU]/mL (ref 0.35–4.50)

## 2019-01-01 MED ORDER — LEVOTHYROXINE SODIUM 88 MCG PO TABS: 88.0000 ug | ORAL_TABLET | Freq: Every day | ORAL | 3 refills | Status: AC

## 2019-01-01 NOTE — Patient Instructions (Addendum)
A few things to remember from today's visit:   Essential hypertension - Plan: Comprehensive metabolic panel  Acquired hypothyroidism - Plan: TSH, levothyroxine (SYNTHROID, LEVOTHROID) 88 MCG tablet  Elevated transaminase level - Plan: Comprehensive metabolic panel  Encounter for HCV screening test for high risk patient - Plan: Hepatitis C antibody  Hyperlipidemia, unspecified hyperlipidemia type - Plan: Lipid panel  Medicare annual wellness visit, subsequent   A few tips:  -As we age balance is not as good as it was, so there is a higher risks for falls. Please remove small rugs and furniture that is "in your way" and could increase the risk of falls. Stretching exercises may help with fall prevention: Yoga and Tai Chi are some examples. Low impact exercise is better, so you are not very achy the next day.  -Sun screen and avoidance of direct sun light recommended. Caution with dehydration, if working outdoors be sure to drink enough fluids.  - Some medications are not safe as we age, increases the risk of side effects and can potentially interact with other medication you are also taken;  including some of over the counter medications. Be sure to let me know when you start a new medication even if it is a dietary/vitamin supplement.   -Healthy diet low in red meet/animal fat and sugar + regular physical activity is recommended.       Screening schedule for the next 5-10 years:  Colonoscopy in 2023.  Glaucoma screening/eye exam every 1-2 years.  Mammogram for breast cancer screening annually. DEXA order placed. Flu vaccine annually.  Diabetes screening periodically and annual cholesterol.  Fall prevention   Advance directives:  Please see a lawyer and/or go to this website to help you with advanced directives and designating a health care power of attorney so that your wishes will be followed should you become too ill to make your own medical  decisions.  [email or web address]       Please be sure medication list is accurate. If a new problem present, please set up appointment sooner than planned today.

## 2019-01-01 NOTE — Assessment & Plan Note (Signed)
For now she will continue nonpharmacologic treatment. Further recommendation will be given according to lipid panel results as well as 10 years CVD risk.

## 2019-01-01 NOTE — Progress Notes (Signed)
HPI:   Ms.Necha T Mims is a 68 y.o. female, who is here today to establish care.  Former PCP: Dr. Burnice Logan Last preventive routine visit: 09/2018. Last preventive Medicare visit: She does not recall having one in the past 2 years. She agrees with having Medicare visit today.  Chronic medical problems: Hypertension, hypothyroidism, chronic low back pain, and hyperlipidemia among some.  Hypertension: Diagnosed 5+ years ago. Currently she is on lisinopril-HCTZ 10-12.5 mg daily. Denies severe/frequent headache, visual changes, chest pain, dyspnea, palpitation, claudication, focal weakness, or edema.  Hypothyroidism: On levothyroxine 88 mcg daily.  Tolerating medication well, no side effects reported. She has not noted dysphagia, palpitations, abdominal pain, changes in bowel habits, tremor, cold/heat intolerance, or abnormal weight loss.  Lab Results  Component Value Date   TSH 0.50 02/01/2017    Hyperlipidemia:  Currently on nonpharmacologic treatment. Following a low fat diet: Yes.  Lab Results  Component Value Date   CHOL 202 (H) 12/13/2016   HDL 33.00 (L) 12/13/2016   LDLCALC 140 (H) 12/13/2016   LDLDIRECT 140.8 02/24/2012   TRIG 143.0 12/13/2016   CHOLHDL 6 12/13/2016    Chronic pain, lower back.  Pain has not had major pain for about a year.  She has taken hydrocodone-acetaminophen 5-325 mg as needed, she has not taken medication in a year.  Concerns today: Medication refills.  Colonoscopy in 12/2011. Mammogram in 07/2018. DEXA: Never  She lives with her son. She works part-time, 3 times per week, in the ER.  She exercises regularly, walks a few times per week. In general she follows a healthful diet.  Independent ADL's and IADL's. Negative for falls in the past year and denies depression symptoms.  Functional Status Survey: Is the patient deaf or have difficulty hearing?: No Does the patient have difficulty seeing, even when wearing  glasses/contacts?: No Does the patient have difficulty concentrating, remembering, or making decisions?: No Does the patient have difficulty walking or climbing stairs?: No Does the patient have difficulty dressing or bathing?: No Does the patient have difficulty doing errands alone such as visiting a doctor's office or shopping?: No  Fall Risk  01/01/2019 01/01/2019  Falls in the past year? 0 0  Number falls in past yr: 0 0  Injury with Fall? 0 -  Follow up - Falls evaluation completed  Some encounter information is confidential and restricted. Go to Review Flowsheets activity to see all data.    Providers she sees regularly:  Eye care provider: Dr. Gershon Crane.  Last visit on 09/18/2018. Gyn, Dr Glo Herring.  Last visit in 09/2018.  Depression screen PHQ 2/9 01/01/2019  Decreased Interest 0  Down, Depressed, Hopeless 0  PHQ - 2 Score 0  Some encounter information is confidential and restricted. Go to Review Flowsheets activity to see all data.    Mini-Cog - 01/01/19 1104    Normal clock drawing test?  yes    How many words correct?  3        Visual Acuity Screening   Right eye Left eye Both eyes  Without correction: 20/25 20/25 20/25   With correction: 20/25 20/25 20/20      Review of Systems  Constitutional: Negative for activity change, appetite change, fatigue and fever.  HENT: Negative for mouth sores, nosebleeds and trouble swallowing.   Eyes: Negative for redness and visual disturbance.  Respiratory: Negative for cough, shortness of breath and wheezing.   Cardiovascular: Negative for chest pain, palpitations and leg swelling.  Gastrointestinal: Negative for  abdominal pain, nausea and vomiting.       Negative for changes in bowel habits.  Genitourinary: Negative for decreased urine volume, dysuria and hematuria.  Neurological: Negative for syncope, weakness and headaches.      Current Outpatient Medications on File Prior to Visit  Medication Sig Dispense Refill  .  HYDROcodone-acetaminophen (NORCO/VICODIN) 5-325 MG tablet Take 1 tablet by mouth as needed for moderate pain.    Marland Kitchen lisinopril-hydrochlorothiazide (PRINZIDE,ZESTORETIC) 10-12.5 MG tablet TAKE 1 TABLET BY MOUTH DAILY. 90 tablet 3   No current facility-administered medications on file prior to visit.      Past Medical History:  Diagnosis Date  . Headache(784.0)   . HTN (hypertension)   . Hypothyroidism   . Internal hemorrhoids   . Melanosis coli   . Tubulovillous adenoma 06/2008   1cm flat ascending colon   No Known Allergies  Family History  Problem Relation Age of Onset  . Breast cancer Sister        two, one deceased  . Cancer Sister        breast ca  . Pancreatic cancer Mother        deceased, 12  . Heart disease Mother   . Hypertension Mother   . Colon cancer Neg Hx   . Lung cancer Neg Hx   . Ovarian cancer Neg Hx     Social History   Socioeconomic History  . Marital status: Widowed    Spouse name: Not on file  . Number of children: 1  . Years of education: Not on file  . Highest education level: Not on file  Occupational History  . Occupation: Research scientist (physical sciences): [employer]  . Financial resource strain: Not on file  . Food insecurity:    Worry: Not on file    Inability: Not on file  . Transportation needs:    Medical: Not on file    Non-medical: Not on file  Tobacco Use  . Smoking status: Never Smoker  . Smokeless tobacco: Never Used  Substance and Sexual Activity  . Alcohol use: No  . Drug use: No  . Sexual activity: Yes    Birth control/protection: Post-menopausal  Lifestyle  . Physical activity:    Days per week: Not on file    Minutes per session: Not on file  . Stress: Not on file  Relationships  . Social connections:    Talks on phone: Not on file    Gets together: Not on file    Attends religious service: Not on file    Active member of club or organization: Not on file    Attends meetings of clubs or  organizations: Not on file    Relationship status: Not on file  Other Topics Concern  . Not on file  Social History Narrative  . Not on file    Vitals:   01/01/19 1035  BP: 110/74  Pulse: 61  Resp: 12  Temp: 98.4 F (36.9 C)  SpO2: 99%   Body mass index is 27.19 kg/m.   Physical Exam  Nursing note and vitals reviewed. Constitutional: She is oriented to person, place, and time. She appears well-developed and well-nourished. No distress.  HENT:  Head: Normocephalic and atraumatic.  Mouth/Throat: Oropharynx is clear and moist and mucous membranes are normal.  Eyes: Pupils are equal, round, and reactive to light. Conjunctivae are normal.  Neck: No tracheal deviation present. No thyromegaly present.  Cardiovascular: Normal rate and regular rhythm.  No murmur heard. Pulses:      Dorsalis pedis pulses are 2+ on the right side and 2+ on the left side.  Respiratory: Effort normal and breath sounds normal. No respiratory distress.  GI: Soft. She exhibits no mass. There is no hepatomegaly. There is no abdominal tenderness.  Musculoskeletal:        General: No edema.  Lymphadenopathy:    She has no cervical adenopathy.  Neurological: She is alert and oriented to person, place, and time. She has normal strength. No cranial nerve deficit. Gait normal.  Skin: Skin is warm. No rash noted. No erythema.  Psychiatric: She has a normal mood and affect.  Well groomed, good eye contact.    ASSESSMENT AND PLAN:  Ms. Ming was seen today for establish care and medication refill.  Orders Placed This Encounter  Procedures  . Pneumococcal polysaccharide vaccine 23-valent greater than or equal to 2yo subcutaneous/IM  . Comprehensive metabolic panel  . TSH  . Hepatitis C antibody  . Lipid panel   Lab Results  Component Value Date   CHOL 191 01/01/2019   HDL 35.50 (L) 01/01/2019   LDLCALC 140 (H) 12/13/2016   LDLDIRECT 136.0 01/01/2019   TRIG 213.0 (H) 01/01/2019   CHOLHDL 5  01/01/2019   Lab Results  Component Value Date   ALT 63 (H) 01/01/2019   AST 57 (H) 01/01/2019   ALKPHOS 67 01/01/2019   BILITOT 1.2 01/01/2019   Lab Results  Component Value Date   CREATININE 1.02 01/01/2019   BUN 18 01/01/2019   NA 139 01/01/2019   K 4.5 01/01/2019   CL 102 01/01/2019   CO2 30 01/01/2019   Lab Results  Component Value Date   TSH 0.39 01/01/2019    Elevated transaminase level We discussed possible etiologies. She denies high alcohol intake.  If LFTs still abnormal, will consider liver US.  Encounter for HCV screening test for high risk patient -     Hepatitis C antibody  Hyperlipidemia, unspecified hyperlipidemia type Continue nonpharmacologic treatment. Further recommendation will be given according to 10 years CVD risk as well as lipid panel results.  The 10-year ASCVD risk score Mikey Bussing DC Jr., et al., 2013) is: 7.4%   Values used to calculate the score:     Age: 21 years     Sex: Female     Is Non-Hispanic African American: Yes     Diabetic: No     Tobacco smoker: No     Systolic Blood Pressure: 841 mmHg     Is BP treated: Yes     HDL Cholesterol: 35.5 mg/dL     Total Cholesterol: 191 mg/dL  -     Lipid panel  Medicare annual wellness visit, subsequent We discussed the importance of staying active, physically and mentally, as well as the benefits of a healthy/balance diet. Low impact exercise that involve stretching and strengthing are ideal. Vaccines up-to-date. We discussed preventive screening for the next 5-10 years, summery of recommendations given in AVS:   Colonoscopy in a couple weeks. Mammogram due after 07/2019. DEXA order placed. Eye exam/glaucoma screening every 1 to 2 years. Diabetes screening periodically and annual cholesterol. Fall prevention.  Advance directives and end of life discussed, strongly recommended.  Website information when she can find more resources was given on AVS.   Need for 23-polyvalent  pneumococcal polysaccharide vaccine -     Pneumococcal polysaccharide vaccine 23-valent greater than or equal to 2yo subcutaneous/IM  Hyperlipidemia For now she will continue  nonpharmacologic treatment. Further recommendation will be given according to lipid panel results as well as 10 years CVD risk.  Hypertension Adequately controlled. No changes in current management. Continue low-salt diet. Eye exam recommended annually, it is current.    Hypothyroidism No changes in current management, will follow labs done today and will give further recommendations accordingly.    Return in about 39 weeks (around 10/01/2019) for cpe.    Betty G. Martinique, MD  [facility]. [place] office.

## 2019-01-01 NOTE — Assessment & Plan Note (Signed)
No changes in current management, will follow labs done today and will give further recommendations accordingly.  

## 2019-01-01 NOTE — Assessment & Plan Note (Signed)
Adequately controlled. No changes in current management. Continue low-salt diet. Eye exam recommended annually, it is current.

## 2019-01-02 LAB — HEPATITIS C ANTIBODY
Hepatitis C Ab: NONREACTIVE
SIGNAL TO CUT-OFF: 0.02 (ref <1.00)

## 2019-01-05 MED ORDER — LISINOPRIL-HYDROCHLOROTHIAZIDE 10-12.5 MG PO TABS: 1.0000 | ORAL_TABLET | Freq: Every day | ORAL | 2 refills | Status: AC

## 2019-01-18 ENCOUNTER — Ambulatory Visit
Admission: RE | Admit: 2019-01-18 | Discharge: 2019-01-18 | Disposition: A | Discharge status: Home / Self Care | Payer: PPO | Source: Ambulatory Visit | Attending: Family Medicine | Admitting: Family Medicine

## 2019-01-18 DIAGNOSIS — R740 Nonspecific elevation of levels of transaminase and lactic acid dehydrogenase [LDH]: Secondary | ICD-10-CM | POA: Diagnosis not present

## 2019-01-18 DIAGNOSIS — R7401 Elevation of levels of liver transaminase levels: Secondary | ICD-10-CM

## 2019-01-29 MED FILL — LEVOTHYROXINE 88 MCG TABLET: 88 | 90 days supply | Qty: 90 | Fill #0

## 2019-01-30 ENCOUNTER — Other Ambulatory Visit: Payer: Self-pay | Admitting: *Deleted

## 2019-01-30 NOTE — Patient Outreach (Signed)
[facility] HRA follow up outreach. No answer, but left a message and requested a return call.  [name]. Amy Neither, MSN, [facility] Gerontological Nurse Practitioner [facility] Care Management [phone]

## 2019-03-07 MED FILL — LISINOPRIL-HCTZ 10-12.5 MG: 10-12.5 | 90 days supply | Qty: 90 | Fill #0

## 2019-03-30 ENCOUNTER — Encounter: Payer: Self-pay | Admitting: *Deleted

## 2019-03-30 ENCOUNTER — Other Ambulatory Visit: Payer: Self-pay | Admitting: *Deleted

## 2019-03-30 NOTE — Patient Outreach (Signed)
HTA HRA Screening outreach attempt #2, called home and cell numbers without success. Left a message and advised will send a letter regarding services.  Eulah Pont. Myrtie Neither, MSN, [facility] Gerontological Nurse Practitioner [facility] Care Management [phone]

## 2019-05-02 MED FILL — LEVOTHYROXINE 88 MCG TABLET: 88 | 90 days supply | Qty: 90 | Fill #1

## 2019-06-11 MED FILL — LISINOPRIL-HCTZ 10-12.5 MG: 10-12.5 | 30 days supply | Qty: 30 | Fill #1

## 2019-07-12 MED FILL — LISINOPRIL-HCTZ 10-12.5 MG: 10-12.5 | 90 days supply | Qty: 90 | Fill #2

## 2019-08-02 MED FILL — LEVOTHYROXINE 88 MCG TABLET: 88 | 90 days supply | Qty: 90 | Fill #2

## 2019-08-13 ENCOUNTER — Other Ambulatory Visit (HOSPITAL_COMMUNITY): Payer: Self-pay | Admitting: Family Medicine

## 2019-08-13 DIAGNOSIS — Z1231 Encounter for screening mammogram for malignant neoplasm of breast: Secondary | ICD-10-CM

## 2019-08-29 ENCOUNTER — Ambulatory Visit (HOSPITAL_COMMUNITY)
Admission: RE | Admit: 2019-08-29 | Discharge: 2019-08-29 | Disposition: A | Discharge status: Home / Self Care | Payer: PPO | Source: Ambulatory Visit | Attending: Family Medicine | Admitting: Family Medicine

## 2019-08-29 ENCOUNTER — Other Ambulatory Visit: Payer: Self-pay

## 2019-08-29 DIAGNOSIS — Z1231 Encounter for screening mammogram for malignant neoplasm of breast: Secondary | ICD-10-CM | POA: Insufficient documentation

## 2019-09-21 ENCOUNTER — Ambulatory Visit: Payer: Self-pay

## 2019-09-21 NOTE — Telephone Encounter (Signed)
Patient called stating that she has had dizziness for about 1 week. It comes and goes.  She states it is a lightheaded feeling. And she feels woozy.  She has not passed out and she has continued to work. She has no other symptoms.  She states that she takes BP medication but is not able to take her BP at home. Care advice read to patient.  She verbalized understanding. Call transferred to office. Patient advised to go to [facility] or ER for evaluation.  She will need inpatient visit and no appointment are available with the office.  She verbalized understanding.   Reason for Disposition . Taking a medicine that could cause dizziness (e.g., blood pressure medications, diuretics)  Answer Assessment - Initial Assessment Questions 1. DESCRIPTION: "Describe your dizziness."     lighheaded 2. LIGHTHEADED: "Do you feel lightheaded?" (e.g., somewhat faint, woozy, weak upon standing)     woozy 3. VERTIGO: "Do you feel like either you or the room is spinning or tilting?" (i.e. vertigo)    No 4. SEVERITY: "How bad is it?"  "Do you feel like you are going to faint?" "Can you stand and walk?"   - MILD - walking normally   - MODERATE - interferes with normal activities (e.g., work, school)    - SEVERE - unable to stand, requires support to walk, feels like passing out now.     mild 5. ONSET:  "When did the dizziness begin?"    Just this weak 6. AGGRAVATING FACTORS: "Does anything make it worse?" (e.g., standing, change in head position)    standing 7. HEART RATE: "Can you tell me your heart rate?" "How many beats in 15 seconds?"  (Note: not all patients can do this)       no 8. CAUSE: "What do you think is causing the dizziness?"     Possible BP issue 9. RECURRENT SYMPTOM: "Have you had dizziness before?" If so, ask: "When was the last time?" "What happened that time?"    Nothing like this 10. OTHER SYMPTOMS: "Do you have any other symptoms?" (e.g., fever, chest pain, vomiting, diarrhea, bleeding)       no 11. PREGNANCY: "Is there any chance you are pregnant?" "When was your last menstrual period?"       N/A  Protocols used: DIZZINESS [facility]

## 2019-09-21 NOTE — Telephone Encounter (Signed)
Will monitor for ED or UC arrival

## 2019-09-24 DIAGNOSIS — Z961 Presence of intraocular lens: Secondary | ICD-10-CM | POA: Diagnosis not present

## 2019-09-24 DIAGNOSIS — H43391 Other vitreous opacities, right eye: Secondary | ICD-10-CM | POA: Diagnosis not present

## 2019-09-24 DIAGNOSIS — H52202 Unspecified astigmatism, left eye: Secondary | ICD-10-CM | POA: Diagnosis not present

## 2019-09-24 DIAGNOSIS — H524 Presbyopia: Secondary | ICD-10-CM | POA: Diagnosis not present

## 2019-10-16 MED FILL — LISINOPRIL-HCTZ 10-12.5 MG: 10-12.5 | 60 days supply | Qty: 60 | Fill #3

## 2019-11-07 MED FILL — LEVOTHYROXINE 88 MCG TABLET: 88 | 90 days supply | Qty: 90 | Fill #3

## 2019-11-20 DIAGNOSIS — M549 Dorsalgia, unspecified: Secondary | ICD-10-CM | POA: Diagnosis not present

## 2019-11-20 DIAGNOSIS — Z1159 Encounter for screening for other viral diseases: Secondary | ICD-10-CM | POA: Diagnosis not present

## 2019-11-20 DIAGNOSIS — Z79899 Other long term (current) drug therapy: Secondary | ICD-10-CM | POA: Diagnosis not present

## 2019-11-20 DIAGNOSIS — Z008 Encounter for other general examination: Secondary | ICD-10-CM | POA: Diagnosis not present

## 2019-11-20 DIAGNOSIS — E785 Hyperlipidemia, unspecified: Secondary | ICD-10-CM | POA: Diagnosis not present

## 2019-11-20 DIAGNOSIS — I10 Essential (primary) hypertension: Secondary | ICD-10-CM | POA: Diagnosis not present

## 2019-11-20 DIAGNOSIS — R7303 Prediabetes: Secondary | ICD-10-CM | POA: Diagnosis not present

## 2019-11-20 DIAGNOSIS — E039 Hypothyroidism, unspecified: Secondary | ICD-10-CM | POA: Diagnosis not present

## 2019-11-20 DIAGNOSIS — Z0000 Encounter for general adult medical examination without abnormal findings: Secondary | ICD-10-CM | POA: Diagnosis not present

## 2019-11-20 DIAGNOSIS — G8929 Other chronic pain: Secondary | ICD-10-CM | POA: Diagnosis not present

## 2019-12-06 DIAGNOSIS — R799 Abnormal finding of blood chemistry, unspecified: Secondary | ICD-10-CM | POA: Diagnosis not present

## 2019-12-06 DIAGNOSIS — Z0001 Encounter for general adult medical examination with abnormal findings: Secondary | ICD-10-CM | POA: Diagnosis not present

## 2019-12-06 DIAGNOSIS — Z7189 Other specified counseling: Secondary | ICD-10-CM | POA: Diagnosis not present

## 2019-12-06 DIAGNOSIS — G8929 Other chronic pain: Secondary | ICD-10-CM | POA: Diagnosis not present

## 2019-12-06 DIAGNOSIS — I10 Essential (primary) hypertension: Secondary | ICD-10-CM | POA: Diagnosis not present

## 2019-12-06 DIAGNOSIS — R6889 Other general symptoms and signs: Secondary | ICD-10-CM | POA: Diagnosis not present

## 2019-12-06 DIAGNOSIS — M549 Dorsalgia, unspecified: Secondary | ICD-10-CM | POA: Diagnosis not present

## 2019-12-06 DIAGNOSIS — Z79899 Other long term (current) drug therapy: Secondary | ICD-10-CM | POA: Diagnosis not present

## 2019-12-06 DIAGNOSIS — Z1211 Encounter for screening for malignant neoplasm of colon: Secondary | ICD-10-CM | POA: Diagnosis not present

## 2019-12-06 DIAGNOSIS — E785 Hyperlipidemia, unspecified: Secondary | ICD-10-CM | POA: Diagnosis not present

## 2019-12-06 DIAGNOSIS — E663 Overweight: Secondary | ICD-10-CM | POA: Diagnosis not present

## 2019-12-06 DIAGNOSIS — E039 Hypothyroidism, unspecified: Secondary | ICD-10-CM | POA: Diagnosis not present

## 2020-01-29 DIAGNOSIS — I129 Hypertensive chronic kidney disease with stage 1 through stage 4 chronic kidney disease, or unspecified chronic kidney disease: Secondary | ICD-10-CM | POA: Diagnosis not present

## 2020-01-29 DIAGNOSIS — E663 Overweight: Secondary | ICD-10-CM | POA: Diagnosis not present

## 2020-01-29 DIAGNOSIS — E559 Vitamin D deficiency, unspecified: Secondary | ICD-10-CM | POA: Diagnosis not present

## 2020-01-29 DIAGNOSIS — Z008 Encounter for other general examination: Secondary | ICD-10-CM | POA: Diagnosis not present

## 2020-01-29 DIAGNOSIS — Z6827 Body mass index (BMI) 27.0-27.9, adult: Secondary | ICD-10-CM | POA: Diagnosis not present

## 2020-01-29 DIAGNOSIS — H547 Unspecified visual loss: Secondary | ICD-10-CM | POA: Diagnosis not present

## 2020-01-29 DIAGNOSIS — R6889 Other general symptoms and signs: Secondary | ICD-10-CM | POA: Diagnosis not present

## 2020-01-29 DIAGNOSIS — E039 Hypothyroidism, unspecified: Secondary | ICD-10-CM | POA: Diagnosis not present

## 2020-01-29 DIAGNOSIS — N1831 Chronic kidney disease, stage 3a: Secondary | ICD-10-CM | POA: Diagnosis not present

## 2020-01-29 DIAGNOSIS — F419 Anxiety disorder, unspecified: Secondary | ICD-10-CM | POA: Diagnosis not present

## 2020-01-30 ENCOUNTER — Telehealth: Payer: Self-pay | Admitting: Gastroenterology

## 2020-01-30 ENCOUNTER — Encounter: Payer: Self-pay | Admitting: Gastroenterology

## 2020-01-30 NOTE — Telephone Encounter (Signed)
Pt is aware of NV and letter mailed

## 2020-01-30 NOTE — Telephone Encounter (Signed)
Pt's had colonoscopy in 2013 and was to have another in 5 yrs. It doesn't look like she did and is wanting to set one up now. Does she need an OV or NV? [phone]

## 2020-01-30 NOTE — Telephone Encounter (Signed)
NV will be fine.

## 2020-03-05 ENCOUNTER — Encounter: Payer: Self-pay | Admitting: *Deleted

## 2020-03-05 ENCOUNTER — Other Ambulatory Visit: Payer: Self-pay

## 2020-03-05 ENCOUNTER — Ambulatory Visit: Payer: PPO | Admitting: Nurse Practitioner

## 2020-03-05 ENCOUNTER — Encounter: Payer: Self-pay | Admitting: Nurse Practitioner

## 2020-03-05 VITALS — BP 105/66 | HR 79 | Temp 97.1°F | Ht 67.0 in | Wt 177.6 lb

## 2020-03-05 DIAGNOSIS — Z86010 Personal history of colonic polyps: Secondary | ICD-10-CM | POA: Diagnosis not present

## 2020-03-05 DIAGNOSIS — K5900 Constipation, unspecified: Secondary | ICD-10-CM

## 2020-03-05 DIAGNOSIS — Z860101 Personal history of adenomatous and serrated colon polyps: Secondary | ICD-10-CM

## 2020-03-05 NOTE — Assessment & Plan Note (Addendum)
The patient has a history of adenomatous colon polyps, most recently in 2013 with a serrated polyp in the descending colon found to be tubular adenoma.  Recommended 5-year repeat exam.  She is currently about 3 years overdue.  Generally asymptomatic from a GI standpoint other than some "rectal fullness" in the setting of known hemorrhoids and more frequent bowel movements with a history of constipation.  She declines rectal exam today in deference to colonoscopy.  Further recommendations to follow.  Proceed with TCS with Dr. Gala Romney (in Dr. Oneida Alar' absence) in near future: the risks, benefits, and alternatives have been discussed with the patient in detail. The patient states understanding and desires to proceed.  The patient is currently on hydrocodone.  She was on hydrocodone in 2013 and did well with minimal conscious sedation.  She states she rarely uses this, will at times go 1 or 2 years without taking it.  She has not taken any hydrocodone as of yet this year.The patient is not on any other anticoagulants, anxiolytics, chronic pain medications, antidepressants, antidiabetics, or iron supplements.  Conscious sedation should be adequate for her procedure as it was for her last.

## 2020-03-05 NOTE — Progress Notes (Signed)
Primary Care Physician:  Loretha Brasil, FNP Primary Gastroenterologist:  Dr. Oneida Alar  Chief Complaint  Patient presents with  . Colonoscopy  . Constipation    2 bm/day recently; odd feeling at rectum    HPI:   [name] is a 69 y.o. female who presents to schedule colonoscopy: Nurse/phone triage was deferred office visit due to complaints of constipation.  Previous colonoscopy completed 12/17/2011 which found a sessile polyp in the descending colon, internal hemorrhoids.  Surgical pathology found the polyp to be tubular adenoma without high-grade dysplasia or malignancy.  Recommended Amitiza for constipation, high-fiber diet, repeat colonoscopy in 5 years (2018).  Today she states she's doing ok overall. Has chronic constipation for her whole life. In the last 2 months she has been going to the bathroom twice a day. Also having a rectal "fullness" sensation that feels weight; almost "like I need to go again, but then I don't." Stools are consistent with [facility] 4. No recent medication changes. She has recently had Vitamin D deficiency. Denies abdominal pain, N/V, hematochezia, melena, fever, chills, unintentional weight loss. Denies URI or flu-like symptoms. Denies loss of sense of taste or smell. She has had both COVID-19 vaccines. Denies chest pain, dyspnea, dizziness, lightheadedness, syncope, near syncope. Denies any other upper or lower GI symptoms.  Past Medical History:  Diagnosis Date  . Headache(784.0)   . HTN (hypertension)   . Hypothyroidism   . Internal hemorrhoids   . Melanosis coli   . Tubulovillous adenoma 06/2008   1cm flat ascending colon    Past Surgical History:  Procedure Laterality Date  . CATARACT EXTRACTION  2011   bilateral  . COLONOSCOPY  06/04/08   moderate internal hemorrhoids/melanosis coli/tubulovillous adenoma 1cm flat  . COLONOSCOPY  12/17/2011   Procedure: COLONOSCOPY;  Surgeon: Dorothyann Peng, MD;  Location: [facility]ITE;  Service: Endoscopy;   Laterality: N/A;  12:30  . DIAGNOSTIC MAMMOGRAM    . TUBAL LIGATION  1977  . VAGINAL DELIVERY      Current Outpatient Medications  Medication Sig Dispense Refill  . HYDROcodone-acetaminophen (NORCO/VICODIN) 5-325 MG tablet Take 1 tablet by mouth as needed for moderate pain.    Marland Kitchen levothyroxine (SYNTHROID, LEVOTHROID) 88 MCG tablet Take 1 tablet (88 mcg total) by mouth daily. 90 tablet 3  . lisinopril-hydrochlorothiazide (PRINZIDE,ZESTORETIC) 10-12.5 MG tablet Take 1 tablet by mouth daily. 90 tablet 2   No current facility-administered medications for this visit.    Allergies as of 03/05/2020  . (No Known Allergies)    Family History  Problem Relation Age of Onset  . Breast cancer Sister        two, one deceased  . Cancer Sister        breast ca  . Pancreatic cancer Mother        deceased, 49  . Heart disease Mother   . Hypertension Mother   . Colon cancer Neg Hx   . Lung cancer Neg Hx   . Ovarian cancer Neg Hx     Social History   Socioeconomic History  . Marital status: Widowed    Spouse name: Not on file  . Number of children: 1  . Years of education: Not on file  . Highest education level: Not on file  Occupational History  . Occupation: Research scientist (physical sciences): [employer]  Tobacco Use  . Smoking status: Never Smoker  . Smokeless tobacco: Never Used  Substance and Sexual Activity  . Alcohol  use: No  . Drug use: No  . Sexual activity: Yes    Birth control/protection: Post-menopausal  Other Topics Concern  . Not on file  Social History Narrative  . Not on file   Social Determinants of Health   Financial Resource Strain:   . Difficulty of Paying Living Expenses:   Food Insecurity:   . Worried About Charity fundraiser in the Last Year:   . Arboriculturist in the Last Year:   Transportation Needs:   . Film/video editor (Medical):   Marland Kitchen Lack of Transportation (Non-Medical):   Physical Activity:   . Days of Exercise per Week:   . Minutes of  Exercise per Session:   Stress:   . Feeling of Stress :   Social Connections:   . Frequency of Communication with Friends and Family:   . Frequency of Social Gatherings with Friends and Family:   . Attends Religious Services:   . Active Member of Clubs or Organizations:   . Attends Archivist Meetings:   Marland Kitchen Marital Status:   Intimate Partner Violence:   . Fear of Current or Ex-Partner:   . Emotionally Abused:   Marland Kitchen Physically Abused:   . Sexually Abused:     Subjective: Review of Systems  Constitutional: Negative for chills, fever, malaise/fatigue and weight loss.  HENT: Negative for congestion and sore throat.   Respiratory: Negative for cough and shortness of breath.   Cardiovascular: Negative for chest pain and palpitations.  Gastrointestinal: Negative for abdominal pain, blood in stool, diarrhea, melena, nausea and vomiting.  Musculoskeletal: Negative for joint pain and myalgias.  Skin: Negative for rash.  Neurological: Negative for dizziness and weakness.  Endo/Heme/Allergies: Does not bruise/bleed easily.  Psychiatric/Behavioral: Negative for depression. The patient is not nervous/anxious.   All other systems reviewed and are negative.      Objective: BP 105/66   Pulse 79   Temp (!) 97.1 F (36.2 C) (Temporal)   Ht 5\' 7"  (1.702 m)   Wt 177 lb 9.6 oz (80.6 kg)   BMI 27.82 kg/m  Physical Exam Vitals and nursing note reviewed.  Constitutional:      General: She is not in acute distress.    Appearance: Normal appearance. She is well-developed. She is not ill-appearing, toxic-appearing or diaphoretic.  HENT:     Head: Normocephalic and atraumatic.     Nose: No congestion or rhinorrhea.  Eyes:     General: No scleral icterus. Cardiovascular:     Rate and Rhythm: Normal rate and regular rhythm.     Heart sounds: Normal heart sounds.  Pulmonary:     Effort: Pulmonary effort is normal. No respiratory distress.     Breath sounds: Normal breath sounds.    Abdominal:     General: Bowel sounds are normal.     Palpations: Abdomen is soft. There is no hepatomegaly, splenomegaly or mass.     Tenderness: There is no abdominal tenderness. There is no guarding or rebound.     Hernia: No hernia is present.  Skin:    General: Skin is warm and dry.     Coloration: Skin is not jaundiced.     Findings: No rash.  Neurological:     General: No focal deficit present.     Mental Status: She is alert and oriented to person, place, and time.  Psychiatric:        Attention and Perception: Attention normal.        Mood  and Affect: Mood normal.        Speech: Speech normal.        Behavior: Behavior normal.        Thought Content: Thought content normal.        Cognition and Memory: Cognition and memory normal.       03/05/2020 11:16 AM   Disclaimer: This note was dictated with voice recognition software. Similar sounding words can inadvertently be transcribed and may not be corrected upon review.

## 2020-03-05 NOTE — Assessment & Plan Note (Signed)
Previous constipation on Amitiza.  Recently her constipation has improved and she is not having 2 bowel movements a day consistently [place] 4.  No recent medication changes.  She does have some "rectal fullness" that feels like she has to have more bowel movements but cannot.  She feels she has generally complete bowel movements.  Declines rectal exam today in deference to colonoscopy upcoming.  Further recommendations to follow colonoscopy.  Monitor for worsening constipation and let us know if any occurs.

## 2020-03-05 NOTE — Patient Instructions (Signed)
Your health issues we discussed today were:   Need for colonoscopy: 1. We will schedule your colonoscopy for you 2. Further recommendations will follow your colonoscopy 3. Call us if you have any worsening symptoms such as rectal pain, bleeding, etc.  Constipation: 1. Monitor your stools for any bleeding or change to loose stools 2. Call us if you have any recurrent constipation  Overall I recommend:  1. Continue your other current medications 2. Return for follow-up based on recommendations made after colonoscopy 3. Call us if you have any questions or concerns   ---------------------------------------------------------------  I am glad you have gotten your COVID-19 vaccination!  Even though you are fully vaccinated you should continue to wear a mask, socially distance, and wash your hands frequently.  ---------------------------------------------------------------   At [facility] we value your feedback. You may receive a survey about your visit today. Please share your experience as we strive to create trusting relationships with our patients to provide genuine, compassionate, quality care.  We appreciate your understanding and patience as we review any laboratory studies, imaging, and other diagnostic tests that are ordered as we care for you. Our office policy is 5 business days for review of these results, and any emergent or urgent results are addressed in a timely manner for your best interest. If you do not hear from our office in 1 week, please contact us.   We also encourage the use of MyChart, which contains your medical information for your review as well. If you are not enrolled in this feature, an access code is on this after visit summary for your convenience. Thank you for allowing [place] to be involved in your care.  It was great to see you today!  I hope you have a great Summer!!

## 2020-03-11 ENCOUNTER — Ambulatory Visit: Payer: PPO

## 2020-04-09 ENCOUNTER — Other Ambulatory Visit: Payer: Self-pay

## 2020-04-09 ENCOUNTER — Other Ambulatory Visit (HOSPITAL_COMMUNITY)
Admission: RE | Admit: 2020-04-09 | Discharge: 2020-04-09 | Disposition: A | Discharge status: Home / Self Care | Payer: PPO | Source: Ambulatory Visit | Attending: Internal Medicine | Admitting: Internal Medicine

## 2020-04-09 DIAGNOSIS — Z01812 Encounter for preprocedural laboratory examination: Secondary | ICD-10-CM | POA: Diagnosis not present

## 2020-04-09 DIAGNOSIS — Z20822 Contact with and (suspected) exposure to covid-19: Secondary | ICD-10-CM | POA: Insufficient documentation

## 2020-04-09 LAB — SARS CORONAVIRUS 2 (TAT 6-24 HRS): SARS Coronavirus 2: NEGATIVE

## 2020-04-11 ENCOUNTER — Ambulatory Visit (HOSPITAL_COMMUNITY)
Admission: RE | Admit: 2020-04-11 | Discharge: 2020-04-11 | Disposition: A | Discharge status: Home / Self Care | Payer: PPO | Attending: Internal Medicine | Admitting: Internal Medicine

## 2020-04-11 ENCOUNTER — Encounter (HOSPITAL_COMMUNITY)
Admission: RE | Disposition: A | Discharge status: Home / Self Care | Payer: Self-pay | Source: Home / Self Care | Attending: Internal Medicine

## 2020-04-11 ENCOUNTER — Encounter (HOSPITAL_COMMUNITY): Payer: Self-pay | Admitting: Internal Medicine

## 2020-04-11 ENCOUNTER — Other Ambulatory Visit: Payer: Self-pay

## 2020-04-11 DIAGNOSIS — Z8249 Family history of ischemic heart disease and other diseases of the circulatory system: Secondary | ICD-10-CM | POA: Insufficient documentation

## 2020-04-11 DIAGNOSIS — Z79899 Other long term (current) drug therapy: Secondary | ICD-10-CM | POA: Insufficient documentation

## 2020-04-11 DIAGNOSIS — K5730 Diverticulosis of large intestine without perforation or abscess without bleeding: Secondary | ICD-10-CM | POA: Insufficient documentation

## 2020-04-11 DIAGNOSIS — Z1211 Encounter for screening for malignant neoplasm of colon: Secondary | ICD-10-CM | POA: Diagnosis not present

## 2020-04-11 DIAGNOSIS — E039 Hypothyroidism, unspecified: Secondary | ICD-10-CM | POA: Insufficient documentation

## 2020-04-11 DIAGNOSIS — Z800 Family history of malignant neoplasm of digestive organs: Secondary | ICD-10-CM | POA: Diagnosis not present

## 2020-04-11 DIAGNOSIS — Z79890 Hormone replacement therapy (postmenopausal): Secondary | ICD-10-CM | POA: Insufficient documentation

## 2020-04-11 DIAGNOSIS — I10 Essential (primary) hypertension: Secondary | ICD-10-CM | POA: Diagnosis not present

## 2020-04-11 DIAGNOSIS — Z86010 Personal history of colonic polyps: Secondary | ICD-10-CM | POA: Diagnosis not present

## 2020-04-11 HISTORY — PX: COLONOSCOPY: SHX5424

## 2020-04-11 SURGERY — COLONOSCOPY
Anesthesia: Moderate Sedation

## 2020-04-11 MED ORDER — SODIUM CHLORIDE 0.9 % IV SOLN: INTRAVENOUS | Status: DC

## 2020-04-11 MED ORDER — MEPERIDINE HCL 50 MG/ML IJ SOLN
INTRAMUSCULAR | Status: AC
  Filled 2020-04-11: qty 1

## 2020-04-11 MED ORDER — MEPERIDINE HCL 100 MG/ML IJ SOLN
INTRAMUSCULAR | Status: DC | PRN
  Administered 2020-04-11: 25 mg
  Administered 2020-04-11: 15 mg via INTRAVENOUS

## 2020-04-11 MED ORDER — ONDANSETRON HCL 4 MG/2ML IJ SOLN
INTRAMUSCULAR | Status: DC | PRN
  Administered 2020-04-11: 4 mg via INTRAVENOUS

## 2020-04-11 MED ORDER — MIDAZOLAM HCL 5 MG/5ML IJ SOLN
INTRAMUSCULAR | Status: DC | PRN
  Administered 2020-04-11 (×2): 1 mg via INTRAVENOUS
  Administered 2020-04-11: 2 mg via INTRAVENOUS
  Administered 2020-04-11: 1 mg via INTRAVENOUS

## 2020-04-11 MED ORDER — ONDANSETRON HCL 4 MG/2ML IJ SOLN
INTRAMUSCULAR | Status: AC
  Filled 2020-04-11: qty 2

## 2020-04-11 MED ORDER — MIDAZOLAM HCL 5 MG/5ML IJ SOLN
INTRAMUSCULAR | Status: AC
  Filled 2020-04-11: qty 10

## 2020-04-11 NOTE — H&P (Signed)
@LOGO @   Primary Care Physician:  Loretha Brasil, FNP Primary Gastroenterologist:  Dr. Gala Romney  Pre-Procedure History & Physical: HPI:  [name] is a 69 y.o. female here for surveillance colonoscopy.  History of  serrated adenoma removed 2013.  History chronic constipation but is actually doing better these days.  Minimal use of hydrocodone.  Has no bowel complaints at this time.  No blood per rectum.  Past Medical History:  Diagnosis Date  . Headache(784.0)   . HTN (hypertension)   . Hypothyroidism   . Internal hemorrhoids   . Melanosis coli   . Tubulovillous adenoma 06/2008   1cm flat ascending colon    Past Surgical History:  Procedure Laterality Date  . CATARACT EXTRACTION  2011   bilateral  . COLONOSCOPY  06/04/08   moderate internal hemorrhoids/melanosis coli/tubulovillous adenoma 1cm flat  . COLONOSCOPY  12/17/2011   Procedure: COLONOSCOPY;  Surgeon: Dorothyann Peng, MD;  Location: [facility];  Service: Endoscopy;  Laterality: N/A;  12:30  . DIAGNOSTIC MAMMOGRAM    . TUBAL LIGATION  1977  . VAGINAL DELIVERY      Prior to Admission medications   Medication Sig Start Date End Date Taking? Authorizing Provider  Ascorbic Acid (VITAMIN C) 100 MG tablet Take 100 mg by mouth daily.   Yes [provider]  Cholecalciferol (VITAMIN D3) 50 MCG (2000 UT) TABS Take 2,000 Units by mouth daily.   Yes [provider]  levothyroxine (SYNTHROID, LEVOTHROID) 88 MCG tablet Take 1 tablet (88 mcg total) by mouth daily. 01/01/19  Yes Martinique, Betty G, MD  lisinopril-hydrochlorothiazide (PRINZIDE,ZESTORETIC) 10-12.5 MG tablet Take 1 tablet by mouth daily. 01/05/19  Yes [place], Betty G, MD  HYDROcodone-acetaminophen (NORCO/VICODIN) 5-325 MG tablet Take 1 tablet by mouth daily as needed for severe pain.     [provider]    Allergies as of 03/05/2020  . (No Known Allergies)    Family History  Problem Relation Age of Onset  . Breast cancer Sister        two,  one deceased  . Cancer Sister        breast ca  . Pancreatic cancer Mother        deceased, 34  . Heart disease Mother   . Hypertension Mother   . Colon cancer Neg Hx   . Lung cancer Neg Hx   . Ovarian cancer Neg Hx     Social History   Socioeconomic History  . Marital status: Widowed    Spouse name: Not on file  . Number of children: 1  . Years of education: Not on file  . Highest education level: Not on file  Occupational History  . Occupation: Research scientist (physical sciences): [employer]  Tobacco Use  . Smoking status: Never Smoker  . Smokeless tobacco: Never Used  Vaping Use  . Vaping Use: Never used  Substance and Sexual Activity  . Alcohol use: No  . Drug use: No  . Sexual activity: Yes    Birth control/protection: Post-menopausal  Other Topics Concern  . Not on file  Social History Narrative  . Not on file   Social Determinants of Health   Financial Resource Strain:   . Difficulty of Paying Living Expenses:   Food Insecurity:   . Worried About Charity fundraiser in the Last Year:   . Arboriculturist in the Last Year:   Transportation Needs:   . Film/video editor (Medical):   Marland Kitchen  Lack of Transportation (Non-Medical):   Physical Activity:   . Days of Exercise per Week:   . Minutes of Exercise per Session:   Stress:   . Feeling of Stress :   Social Connections:   . Frequency of Communication with Friends and Family:   . Frequency of Social Gatherings with Friends and Family:   . Attends Religious Services:   . Active Member of Clubs or Organizations:   . Attends Archivist Meetings:   Marland Kitchen Marital Status:   Intimate Partner Violence:   . Fear of Current or Ex-Partner:   . Emotionally Abused:   Marland Kitchen Physically Abused:   . Sexually Abused:     Review of Systems: See HPI, otherwise negative ROS  Physical Exam: BP 94/60   Pulse 74   Temp 97.7 F (36.5 C) (Oral)   Resp 18   Ht 5\' 7"  (1.702 m)   Wt 80.7 kg   SpO2 99%   BMI 27.88 kg/m   General:   Alert,  Well-developed, well-nourished, pleasant and cooperative in NAD Neck:  Supple; no masses or thyromegaly. No significant cervical adenopathy. Lungs:  Clear throughout to auscultation.   No wheezes, crackles, or rhonchi. No acute distress. Heart:  Regular rate and rhythm; no murmurs, clicks, rubs,  or gallops. Abdomen: Non-distended, normal bowel sounds.  Soft and nontender without appreciable mass or hepatosplenomegaly.  Pulses:  Normal pulses noted. Extremities:  Without clubbing or edema.  Impression/Plan:   69 year old lady with history colonic adenoma, distant history of advanced adenoma here for surveillance colonoscopy per plan. The risks, benefits, limitations, alternatives and imponderables have been reviewed with the patient. Questions have been answered. All parties are agreeable.      Notice: This dictation was prepared with Dragon dictation along with smaller phrase technology. Any transcriptional errors that result from this process are unintentional and may not be corrected upon review.

## 2020-04-11 NOTE — Op Note (Signed)
[facility] Patient Name: [name] Procedure Date: 04/11/2020 10:31 AM MRN: [id number] Date of Birth: 25-Jan-1951 Attending MD: Norvel Richards , MD CSN: [id number] Age: 69 Admit Type: Outpatient Procedure:                Colonoscopy Indications:              High risk colon cancer surveillance: Personal                            history of colonic polyps Providers:                Norvel Richards, MD, Janeece Riggers, RN, Crystal                            Page, Aram Candela Referring MD:              Medicines:                Midazolam 5 mg IV, Meperidine 40 mg IV, Ondansetron                            4 mg IV Complications:            No immediate complications. Estimated Blood Loss:     Estimated blood loss: none. Procedure:                Pre-Anesthesia Assessment:                           - Prior to the procedure, a History and Physical                            was performed, and patient medications and                            allergies were reviewed. The patient's tolerance of                            previous anesthesia was also reviewed. The risks                            and benefits of the procedure and the sedation                            options and risks were discussed with the patient.                            All questions were answered, and informed consent                            was obtained. Prior Anticoagulants: The patient has                            taken no previous anticoagulant or antiplatelet  agents. ASA Grade Assessment: II - A patient with                            mild systemic disease. After reviewing the risks                            and benefits, the patient was deemed in                            satisfactory condition to undergo the procedure.                           After obtaining informed consent, the colonoscope                            was passed under direct vision.  Throughout the                            procedure, the patient's blood pressure, pulse, and                            oxygen saturations were monitored continuously. The                            CF-[id number] ([id number]) scope was introduced through                            the anus and advanced to the the cecum, identified                            by appendiceal orifice and ileocecal valve. The                            colonoscopy was performed without difficulty. The                            patient tolerated the procedure well. The quality                            of the bowel preparation was adequate. Scope In: 11:04:10 AM Scope Out: 11:23:33 AM Scope Withdrawal Time: 0 hours 6 minutes 29 seconds  Total Procedure Duration: 0 hours 19 minutes 23 seconds  Findings:      The perianal and digital rectal examinations were normal.      Scattered medium-mouthed diverticula were found in the sigmoid colon and       descending colon.      The exam was otherwise without abnormality on direct and retroflexion       views. Impression:               - Diverticulosis in the sigmoid colon and in the                            descending colon.                           -  The examination was otherwise normal on direct                            and retroflexion views.                           - No specimens collected. Moderate Sedation:      Moderate (conscious) sedation was administered by the endoscopy nurse       and supervised by the endoscopist. The following parameters were       monitored: oxygen saturation, heart rate, blood pressure, and response       to care. Total physician intraservice time was 23 minutes. Recommendation:           - Patient has a contact number available for                            emergencies. The signs and symptoms of potential                            delayed complications were discussed with the                            patient. Return to normal  activities tomorrow.                            Written discharge instructions were provided to the                            patient.                           - Resume previous diet.                           - Continue present medications.                           - Repeat colonoscopy in 5 years for surveillance.                           - Return to GI office PRN. Procedure Code(s):        --- Professional ---                           [phone], Colonoscopy, flexible; diagnostic, including                            collection of specimen(s) by brushing or washing,                            when performed (separate procedure)                           99153, Moderate sedation; each additional 15  minutes intraservice time                           G0500, Moderate sedation services provided by the                            same physician or other qualified health care                            professional performing a gastrointestinal                            endoscopic service that sedation supports,                            requiring the presence of an independent trained                            observer to assist in the monitoring of the                            patient's level of consciousness and physiological                            status; initial 15 minutes of intra-service time;                            patient age 59 years or older (additional time may                            be reported with [phone], as appropriate) Diagnosis Code(s):        --- Professional ---                           Z86.010, Personal history of colonic polyps                           K57.30, Diverticulosis of large intestine without                            perforation or abscess without bleeding CPT copyright 2019 American Medical Association. All rights reserved. The codes documented in this report are preliminary and upon coder review may  be revised to  meet current compliance requirements. [name]. [name], MD Norvel Richards, MD 04/11/2020 11:28:36 AM This report has been signed electronically. Number of Addenda: 0

## 2020-04-11 NOTE — Discharge Instructions (Signed)
Colonoscopy Discharge Instructions  Read the instructions outlined below and refer to this sheet in the next few weeks. These discharge instructions provide you with general information on caring for yourself after you leave the hospital. Your doctor may also give you specific instructions. While your treatment has been planned according to the most current medical practices available, unavoidable complications occasionally occur. If you have any problems or questions after discharge, call Dr. Gala Romney at [phone]. ACTIVITY  You may resume your regular activity, but move at a slower pace for the next 24 hours.   Take frequent rest periods for the next 24 hours.   Walking will help get rid of the air and reduce the bloated feeling in your belly (abdomen).   No driving for 24 hours (because of the medicine (anesthesia) used during the test).    Do not sign any important legal documents or operate any machinery for 24 hours (because of the anesthesia used during the test).  NUTRITION  Drink plenty of fluids.   You may resume your normal diet as instructed by your doctor.   Begin with a light meal and progress to your normal diet. Heavy or fried foods are harder to digest and may make you feel sick to your stomach (nauseated).   Avoid alcoholic beverages for 24 hours or as instructed.  MEDICATIONS  You may resume your normal medications unless your doctor tells you otherwise.  WHAT YOU CAN EXPECT TODAY  Some feelings of bloating in the abdomen.   Passage of more gas than usual.   Spotting of blood in your stool or on the toilet paper.  IF YOU HAD POLYPS REMOVED DURING THE COLONOSCOPY:  No aspirin products for 7 days or as instructed.   No alcohol for 7 days or as instructed.   Eat a soft diet for the next 24 hours.  FINDING OUT THE RESULTS OF YOUR TEST Not all test results are available during your visit. If your test results are not back during the visit, make an appointment  with your caregiver to find out the results. Do not assume everything is normal if you have not heard from your caregiver or the medical facility. It is important for you to follow up on all of your test results.  SEEK IMMEDIATE MEDICAL ATTENTION IF:  You have more than a spotting of blood in your stool.   Your belly is swollen (abdominal distention).   You are nauseated or vomiting.   You have a temperature over 101.   You have abdominal pain or discomfort that is severe or gets worse throughout the day.   Colonic diverticulosis information provided  Recommend 1 more colonoscopy in 5 years   Diverticulosis  Diverticulosis is a condition that develops when small pouches (diverticula) form in the wall of the large intestine (colon). The colon is where water is absorbed and stool (feces) is formed. The pouches form when the inside layer of the colon pushes through weak spots in the outer layers of the colon. You may have a few pouches or many of them. The pouches usually do not cause problems unless they become inflamed or infected. When this happens, the condition is called diverticulitis. What are the causes? The cause of this condition is not known. What increases the risk? The following factors may make you more likely to develop this condition:  Being older than age 43. Your risk for this condition increases with age. Diverticulosis is rare among people younger than age 21. By  age 50, many people have it.  Eating a low-fiber diet.  Having frequent constipation.  Being overweight.  Not getting enough exercise.  Smoking.  Taking over-the-counter pain medicines, like aspirin and ibuprofen.  Having a family history of diverticulosis. What are the signs or symptoms? In most people, there are no symptoms of this condition. If you do have symptoms, they may include:  Bloating.  Cramps in the abdomen.  Constipation or diarrhea.  Pain in the lower left side of the  abdomen. How is this diagnosed? Because diverticulosis usually has no symptoms, it is most often diagnosed during an exam for other colon problems. The condition may be diagnosed by:  Using a flexible scope to examine the colon (colonoscopy).  Taking an X-ray of the colon after dye has been put into the colon (barium enema).  Having a CT scan. How is this treated? You may not need treatment for this condition. Your health care provider may recommend treatment to prevent problems. You may need treatment if you have symptoms or if you previously had diverticulitis. Treatment may include:  Eating a high-fiber diet.  Taking a fiber supplement.  Taking a live bacteria supplement (probiotic).  Taking medicine to relax your colon. Follow these instructions at home: Medicines  Take over-the-counter and prescription medicines only as told by your health care provider.  If told by your health care provider, take a fiber supplement or probiotic. Constipation prevention Your condition may cause constipation. To prevent or treat constipation, you may need to:  Drink enough fluid to keep your urine pale yellow.  Take over-the-counter or prescription medicines.  Eat foods that are high in fiber, such as beans, whole grains, and fresh fruits and vegetables.  Limit foods that are high in fat and processed sugars, such as fried or sweet foods.  General instructions  Try not to strain when you have a bowel movement.  Keep all follow-up visits as told by your health care provider. This is important. Contact a health care provider if you:  Have pain in your abdomen.  Have bloating.  Have cramps.  Have not had a bowel movement in 3 days. Get help right away if:  Your pain gets worse.  Your bloating becomes very bad.  You have a fever or chills, and your symptoms suddenly get worse.  You vomit.  You have bowel movements that are bloody or black.  You have bleeding from your  rectum. Summary  Diverticulosis is a condition that develops when small pouches (diverticula) form in the wall of the large intestine (colon).  You may have a few pouches or many of them.  This condition is most often diagnosed during an exam for other colon problems.  Treatment may include increasing the fiber in your diet, taking supplements, or taking medicines. This information is not intended to replace advice given to you by your health care provider. Make sure you discuss any questions you have with your health care provider. Document Revised: 05/17/2019 Document Reviewed: 05/17/2019 Elsevier Patient Education  [place].

## 2020-04-16 ENCOUNTER — Encounter (HOSPITAL_COMMUNITY): Payer: Self-pay | Admitting: Internal Medicine

## 2020-05-15 DIAGNOSIS — G8929 Other chronic pain: Secondary | ICD-10-CM | POA: Diagnosis not present

## 2020-05-15 DIAGNOSIS — R7303 Prediabetes: Secondary | ICD-10-CM | POA: Diagnosis not present

## 2020-05-15 DIAGNOSIS — Z0000 Encounter for general adult medical examination without abnormal findings: Secondary | ICD-10-CM | POA: Diagnosis not present

## 2020-05-15 DIAGNOSIS — Z79899 Other long term (current) drug therapy: Secondary | ICD-10-CM | POA: Diagnosis not present

## 2020-05-15 DIAGNOSIS — E663 Overweight: Secondary | ICD-10-CM | POA: Diagnosis not present

## 2020-05-15 DIAGNOSIS — E039 Hypothyroidism, unspecified: Secondary | ICD-10-CM | POA: Diagnosis not present

## 2020-05-15 DIAGNOSIS — E559 Vitamin D deficiency, unspecified: Secondary | ICD-10-CM | POA: Diagnosis not present

## 2020-05-15 DIAGNOSIS — Z008 Encounter for other general examination: Secondary | ICD-10-CM | POA: Diagnosis not present

## 2020-05-15 DIAGNOSIS — E785 Hyperlipidemia, unspecified: Secondary | ICD-10-CM | POA: Diagnosis not present

## 2020-05-15 DIAGNOSIS — M549 Dorsalgia, unspecified: Secondary | ICD-10-CM | POA: Diagnosis not present

## 2020-05-15 DIAGNOSIS — I129 Hypertensive chronic kidney disease with stage 1 through stage 4 chronic kidney disease, or unspecified chronic kidney disease: Secondary | ICD-10-CM | POA: Diagnosis not present

## 2020-05-15 DIAGNOSIS — N1831 Chronic kidney disease, stage 3a: Secondary | ICD-10-CM | POA: Diagnosis not present

## 2020-05-22 ENCOUNTER — Other Ambulatory Visit: Payer: Self-pay | Admitting: Student

## 2020-05-22 DIAGNOSIS — Z780 Asymptomatic menopausal state: Secondary | ICD-10-CM

## 2020-07-21 DIAGNOSIS — L600 Ingrowing nail: Secondary | ICD-10-CM | POA: Diagnosis not present

## 2020-07-21 DIAGNOSIS — M79674 Pain in right toe(s): Secondary | ICD-10-CM | POA: Diagnosis not present

## 2020-08-04 DIAGNOSIS — L600 Ingrowing nail: Secondary | ICD-10-CM | POA: Diagnosis not present

## 2020-08-19 DIAGNOSIS — D519 Vitamin B12 deficiency anemia, unspecified: Secondary | ICD-10-CM | POA: Diagnosis not present

## 2020-08-19 DIAGNOSIS — Z79899 Other long term (current) drug therapy: Secondary | ICD-10-CM | POA: Diagnosis not present

## 2020-08-19 DIAGNOSIS — E039 Hypothyroidism, unspecified: Secondary | ICD-10-CM | POA: Diagnosis not present

## 2020-08-19 DIAGNOSIS — N1831 Chronic kidney disease, stage 3a: Secondary | ICD-10-CM | POA: Diagnosis not present

## 2020-08-19 DIAGNOSIS — Z6825 Body mass index (BMI) 25.0-25.9, adult: Secondary | ICD-10-CM | POA: Diagnosis not present

## 2020-08-19 DIAGNOSIS — K5790 Diverticulosis of intestine, part unspecified, without perforation or abscess without bleeding: Secondary | ICD-10-CM | POA: Diagnosis not present

## 2020-08-19 DIAGNOSIS — I129 Hypertensive chronic kidney disease with stage 1 through stage 4 chronic kidney disease, or unspecified chronic kidney disease: Secondary | ICD-10-CM | POA: Diagnosis not present

## 2020-08-19 DIAGNOSIS — E559 Vitamin D deficiency, unspecified: Secondary | ICD-10-CM | POA: Diagnosis not present

## 2020-08-19 DIAGNOSIS — E663 Overweight: Secondary | ICD-10-CM | POA: Diagnosis not present

## 2020-09-02 ENCOUNTER — Ambulatory Visit
Admission: RE | Admit: 2020-09-02 | Discharge: 2020-09-02 | Disposition: A | Discharge status: Home / Self Care | Payer: PPO | Source: Ambulatory Visit | Attending: Student | Admitting: Student

## 2020-09-02 DIAGNOSIS — Z780 Asymptomatic menopausal state: Secondary | ICD-10-CM | POA: Diagnosis not present

## 2020-09-29 DIAGNOSIS — H524 Presbyopia: Secondary | ICD-10-CM | POA: Diagnosis not present

## 2020-09-29 DIAGNOSIS — Z961 Presence of intraocular lens: Secondary | ICD-10-CM | POA: Diagnosis not present

## 2020-09-30 DIAGNOSIS — S61210A Laceration without foreign body of right index finger without damage to nail, initial encounter: Secondary | ICD-10-CM | POA: Diagnosis not present

## 2020-10-06 ENCOUNTER — Other Ambulatory Visit (HOSPITAL_COMMUNITY): Payer: Self-pay | Admitting: Internal Medicine

## 2020-10-06 DIAGNOSIS — Z1231 Encounter for screening mammogram for malignant neoplasm of breast: Secondary | ICD-10-CM

## 2020-10-07 DIAGNOSIS — I10 Essential (primary) hypertension: Secondary | ICD-10-CM | POA: Diagnosis not present

## 2020-10-07 DIAGNOSIS — Z0189 Encounter for other specified special examinations: Secondary | ICD-10-CM | POA: Diagnosis not present

## 2020-10-07 DIAGNOSIS — E039 Hypothyroidism, unspecified: Secondary | ICD-10-CM | POA: Diagnosis not present

## 2020-10-16 ENCOUNTER — Ambulatory Visit (HOSPITAL_COMMUNITY)
Admission: RE | Admit: 2020-10-16 | Discharge: 2020-10-16 | Disposition: A | Discharge status: Home / Self Care | Payer: PPO | Source: Ambulatory Visit | Attending: Internal Medicine | Admitting: Internal Medicine

## 2020-10-16 ENCOUNTER — Ambulatory Visit (HOSPITAL_COMMUNITY): Payer: PPO

## 2020-10-16 ENCOUNTER — Other Ambulatory Visit: Payer: Self-pay

## 2020-10-16 DIAGNOSIS — Z1231 Encounter for screening mammogram for malignant neoplasm of breast: Secondary | ICD-10-CM | POA: Diagnosis not present

## 2020-11-05 DIAGNOSIS — I10 Essential (primary) hypertension: Secondary | ICD-10-CM | POA: Diagnosis not present

## 2020-11-05 DIAGNOSIS — E039 Hypothyroidism, unspecified: Secondary | ICD-10-CM | POA: Diagnosis not present

## 2020-11-05 DIAGNOSIS — Z0189 Encounter for other specified special examinations: Secondary | ICD-10-CM | POA: Diagnosis not present

## 2020-11-05 DIAGNOSIS — Z712 Person consulting for explanation of examination or test findings: Secondary | ICD-10-CM | POA: Diagnosis not present

## 2020-12-04 DIAGNOSIS — E782 Mixed hyperlipidemia: Secondary | ICD-10-CM | POA: Diagnosis not present

## 2020-12-04 DIAGNOSIS — E039 Hypothyroidism, unspecified: Secondary | ICD-10-CM | POA: Diagnosis not present

## 2020-12-04 DIAGNOSIS — I10 Essential (primary) hypertension: Secondary | ICD-10-CM | POA: Diagnosis not present

## 2020-12-04 DIAGNOSIS — Z0001 Encounter for general adult medical examination with abnormal findings: Secondary | ICD-10-CM | POA: Diagnosis not present

## 2021-01-01 DIAGNOSIS — N1831 Chronic kidney disease, stage 3a: Secondary | ICD-10-CM | POA: Diagnosis not present

## 2021-01-01 DIAGNOSIS — E039 Hypothyroidism, unspecified: Secondary | ICD-10-CM | POA: Diagnosis not present

## 2021-01-01 DIAGNOSIS — Z79899 Other long term (current) drug therapy: Secondary | ICD-10-CM | POA: Diagnosis not present

## 2021-01-01 DIAGNOSIS — Z0189 Encounter for other specified special examinations: Secondary | ICD-10-CM | POA: Diagnosis not present

## 2021-01-01 DIAGNOSIS — I10 Essential (primary) hypertension: Secondary | ICD-10-CM | POA: Diagnosis not present

## 2021-01-01 DIAGNOSIS — E782 Mixed hyperlipidemia: Secondary | ICD-10-CM | POA: Diagnosis not present

## 2021-01-05 DIAGNOSIS — E039 Hypothyroidism, unspecified: Secondary | ICD-10-CM | POA: Diagnosis not present

## 2021-01-05 DIAGNOSIS — E782 Mixed hyperlipidemia: Secondary | ICD-10-CM | POA: Diagnosis not present

## 2021-01-05 DIAGNOSIS — I10 Essential (primary) hypertension: Secondary | ICD-10-CM | POA: Diagnosis not present

## 2021-03-31 DIAGNOSIS — R7301 Impaired fasting glucose: Secondary | ICD-10-CM | POA: Diagnosis not present

## 2021-03-31 DIAGNOSIS — I10 Essential (primary) hypertension: Secondary | ICD-10-CM | POA: Diagnosis not present

## 2021-03-31 DIAGNOSIS — E039 Hypothyroidism, unspecified: Secondary | ICD-10-CM | POA: Diagnosis not present

## 2021-03-31 DIAGNOSIS — N1831 Chronic kidney disease, stage 3a: Secondary | ICD-10-CM | POA: Diagnosis not present

## 2021-04-07 DIAGNOSIS — E039 Hypothyroidism, unspecified: Secondary | ICD-10-CM | POA: Diagnosis not present

## 2021-04-07 DIAGNOSIS — E782 Mixed hyperlipidemia: Secondary | ICD-10-CM | POA: Diagnosis not present

## 2021-04-07 DIAGNOSIS — I10 Essential (primary) hypertension: Secondary | ICD-10-CM | POA: Diagnosis not present

## 2021-04-07 DIAGNOSIS — N1831 Chronic kidney disease, stage 3a: Secondary | ICD-10-CM | POA: Diagnosis not present

## 2021-05-07 DIAGNOSIS — M1712 Unilateral primary osteoarthritis, left knee: Secondary | ICD-10-CM | POA: Diagnosis not present

## 2021-05-21 DIAGNOSIS — M25562 Pain in left knee: Secondary | ICD-10-CM | POA: Diagnosis not present

## 2021-06-01 DIAGNOSIS — M25562 Pain in left knee: Secondary | ICD-10-CM | POA: Diagnosis not present

## 2021-06-18 DIAGNOSIS — M25562 Pain in left knee: Secondary | ICD-10-CM | POA: Diagnosis not present

## 2021-06-25 DIAGNOSIS — M25562 Pain in left knee: Secondary | ICD-10-CM | POA: Diagnosis not present

## 2021-07-07 DIAGNOSIS — Z131 Encounter for screening for diabetes mellitus: Secondary | ICD-10-CM | POA: Diagnosis not present

## 2021-07-07 DIAGNOSIS — R7303 Prediabetes: Secondary | ICD-10-CM | POA: Diagnosis not present

## 2021-07-07 DIAGNOSIS — I10 Essential (primary) hypertension: Secondary | ICD-10-CM | POA: Diagnosis not present

## 2021-07-07 DIAGNOSIS — E559 Vitamin D deficiency, unspecified: Secondary | ICD-10-CM | POA: Diagnosis not present

## 2021-07-07 DIAGNOSIS — E039 Hypothyroidism, unspecified: Secondary | ICD-10-CM | POA: Diagnosis not present

## 2021-07-14 DIAGNOSIS — M25562 Pain in left knee: Secondary | ICD-10-CM | POA: Diagnosis not present

## 2021-07-14 DIAGNOSIS — Z0001 Encounter for general adult medical examination with abnormal findings: Secondary | ICD-10-CM | POA: Diagnosis not present

## 2021-07-14 DIAGNOSIS — E039 Hypothyroidism, unspecified: Secondary | ICD-10-CM | POA: Diagnosis not present

## 2021-07-14 DIAGNOSIS — E782 Mixed hyperlipidemia: Secondary | ICD-10-CM | POA: Diagnosis not present

## 2021-07-14 DIAGNOSIS — N1831 Chronic kidney disease, stage 3a: Secondary | ICD-10-CM | POA: Diagnosis not present

## 2021-07-14 DIAGNOSIS — E559 Vitamin D deficiency, unspecified: Secondary | ICD-10-CM | POA: Diagnosis not present

## 2021-07-14 DIAGNOSIS — I10 Essential (primary) hypertension: Secondary | ICD-10-CM | POA: Diagnosis not present

## 2021-07-14 DIAGNOSIS — R7303 Prediabetes: Secondary | ICD-10-CM | POA: Diagnosis not present

## 2021-07-14 DIAGNOSIS — M25561 Pain in right knee: Secondary | ICD-10-CM | POA: Diagnosis not present

## 2021-07-18 DIAGNOSIS — Z0001 Encounter for general adult medical examination with abnormal findings: Secondary | ICD-10-CM | POA: Diagnosis not present

## 2021-07-18 DIAGNOSIS — M25562 Pain in left knee: Secondary | ICD-10-CM | POA: Diagnosis not present

## 2021-07-18 DIAGNOSIS — E559 Vitamin D deficiency, unspecified: Secondary | ICD-10-CM | POA: Diagnosis not present

## 2021-07-18 DIAGNOSIS — R7303 Prediabetes: Secondary | ICD-10-CM | POA: Diagnosis not present

## 2021-07-18 DIAGNOSIS — I10 Essential (primary) hypertension: Secondary | ICD-10-CM | POA: Diagnosis not present

## 2021-07-18 DIAGNOSIS — N1831 Chronic kidney disease, stage 3a: Secondary | ICD-10-CM | POA: Diagnosis not present

## 2021-07-18 DIAGNOSIS — M25561 Pain in right knee: Secondary | ICD-10-CM | POA: Diagnosis not present

## 2021-07-18 DIAGNOSIS — E039 Hypothyroidism, unspecified: Secondary | ICD-10-CM | POA: Diagnosis not present

## 2021-07-18 DIAGNOSIS — E782 Mixed hyperlipidemia: Secondary | ICD-10-CM | POA: Diagnosis not present

## 2021-09-03 DIAGNOSIS — Z008 Encounter for other general examination: Secondary | ICD-10-CM | POA: Diagnosis not present

## 2021-09-03 DIAGNOSIS — I10 Essential (primary) hypertension: Secondary | ICD-10-CM | POA: Diagnosis not present

## 2021-09-03 DIAGNOSIS — M1990 Unspecified osteoarthritis, unspecified site: Secondary | ICD-10-CM | POA: Diagnosis not present

## 2021-09-03 DIAGNOSIS — R69 Illness, unspecified: Secondary | ICD-10-CM | POA: Diagnosis not present

## 2021-09-03 DIAGNOSIS — Z803 Family history of malignant neoplasm of breast: Secondary | ICD-10-CM | POA: Diagnosis not present

## 2021-09-03 DIAGNOSIS — E559 Vitamin D deficiency, unspecified: Secondary | ICD-10-CM | POA: Diagnosis not present

## 2021-09-03 DIAGNOSIS — Z791 Long term (current) use of non-steroidal anti-inflammatories (NSAID): Secondary | ICD-10-CM | POA: Diagnosis not present

## 2021-09-03 DIAGNOSIS — G8929 Other chronic pain: Secondary | ICD-10-CM | POA: Diagnosis not present

## 2021-09-03 DIAGNOSIS — E785 Hyperlipidemia, unspecified: Secondary | ICD-10-CM | POA: Diagnosis not present

## 2021-09-03 DIAGNOSIS — E039 Hypothyroidism, unspecified: Secondary | ICD-10-CM | POA: Diagnosis not present

## 2021-09-03 DIAGNOSIS — Z8249 Family history of ischemic heart disease and other diseases of the circulatory system: Secondary | ICD-10-CM | POA: Diagnosis not present

## 2021-09-10 ENCOUNTER — Other Ambulatory Visit (HOSPITAL_COMMUNITY): Payer: Self-pay | Admitting: Internal Medicine

## 2021-09-10 DIAGNOSIS — Z1231 Encounter for screening mammogram for malignant neoplasm of breast: Secondary | ICD-10-CM

## 2021-10-19 ENCOUNTER — Other Ambulatory Visit: Payer: Self-pay

## 2021-10-19 ENCOUNTER — Ambulatory Visit (HOSPITAL_COMMUNITY)
Admission: RE | Admit: 2021-10-19 | Discharge: 2021-10-19 | Disposition: A | Discharge status: Home / Self Care | Payer: Medicare HMO | Source: Ambulatory Visit | Attending: Internal Medicine | Admitting: Internal Medicine

## 2021-10-19 DIAGNOSIS — Z1231 Encounter for screening mammogram for malignant neoplasm of breast: Secondary | ICD-10-CM | POA: Insufficient documentation

## 2022-01-08 DIAGNOSIS — I10 Essential (primary) hypertension: Secondary | ICD-10-CM | POA: Diagnosis not present

## 2022-01-08 DIAGNOSIS — R7303 Prediabetes: Secondary | ICD-10-CM | POA: Diagnosis not present

## 2022-01-08 DIAGNOSIS — E039 Hypothyroidism, unspecified: Secondary | ICD-10-CM | POA: Diagnosis not present

## 2022-01-08 DIAGNOSIS — E559 Vitamin D deficiency, unspecified: Secondary | ICD-10-CM | POA: Diagnosis not present

## 2022-02-02 DIAGNOSIS — R7303 Prediabetes: Secondary | ICD-10-CM | POA: Diagnosis not present

## 2022-02-02 DIAGNOSIS — E559 Vitamin D deficiency, unspecified: Secondary | ICD-10-CM | POA: Diagnosis not present

## 2022-02-02 DIAGNOSIS — E039 Hypothyroidism, unspecified: Secondary | ICD-10-CM | POA: Diagnosis not present

## 2022-02-02 DIAGNOSIS — M25561 Pain in right knee: Secondary | ICD-10-CM | POA: Diagnosis not present

## 2022-02-02 DIAGNOSIS — Z0001 Encounter for general adult medical examination with abnormal findings: Secondary | ICD-10-CM | POA: Diagnosis not present

## 2022-02-02 DIAGNOSIS — N1831 Chronic kidney disease, stage 3a: Secondary | ICD-10-CM | POA: Diagnosis not present

## 2022-02-02 DIAGNOSIS — I10 Essential (primary) hypertension: Secondary | ICD-10-CM | POA: Diagnosis not present

## 2022-02-02 DIAGNOSIS — Z0000 Encounter for general adult medical examination without abnormal findings: Secondary | ICD-10-CM | POA: Diagnosis not present

## 2022-02-02 DIAGNOSIS — M25562 Pain in left knee: Secondary | ICD-10-CM | POA: Diagnosis not present

## 2022-02-02 DIAGNOSIS — R5383 Other fatigue: Secondary | ICD-10-CM | POA: Diagnosis not present

## 2022-02-02 DIAGNOSIS — E782 Mixed hyperlipidemia: Secondary | ICD-10-CM | POA: Diagnosis not present

## 2022-02-16 DIAGNOSIS — H40013 Open angle with borderline findings, low risk, bilateral: Secondary | ICD-10-CM | POA: Diagnosis not present

## 2022-02-16 DIAGNOSIS — H52202 Unspecified astigmatism, left eye: Secondary | ICD-10-CM | POA: Diagnosis not present

## 2022-02-16 DIAGNOSIS — Z961 Presence of intraocular lens: Secondary | ICD-10-CM | POA: Diagnosis not present

## 2022-02-16 DIAGNOSIS — H524 Presbyopia: Secondary | ICD-10-CM | POA: Diagnosis not present

## 2022-04-12 DIAGNOSIS — E039 Hypothyroidism, unspecified: Secondary | ICD-10-CM | POA: Diagnosis not present

## 2022-04-12 DIAGNOSIS — M1990 Unspecified osteoarthritis, unspecified site: Secondary | ICD-10-CM | POA: Diagnosis not present

## 2022-04-12 DIAGNOSIS — Z803 Family history of malignant neoplasm of breast: Secondary | ICD-10-CM | POA: Diagnosis not present

## 2022-04-12 DIAGNOSIS — N182 Chronic kidney disease, stage 2 (mild): Secondary | ICD-10-CM | POA: Diagnosis not present

## 2022-04-12 DIAGNOSIS — Z8249 Family history of ischemic heart disease and other diseases of the circulatory system: Secondary | ICD-10-CM | POA: Diagnosis not present

## 2022-04-12 DIAGNOSIS — E785 Hyperlipidemia, unspecified: Secondary | ICD-10-CM | POA: Diagnosis not present

## 2022-04-12 DIAGNOSIS — I129 Hypertensive chronic kidney disease with stage 1 through stage 4 chronic kidney disease, or unspecified chronic kidney disease: Secondary | ICD-10-CM | POA: Diagnosis not present

## 2022-05-07 DIAGNOSIS — E039 Hypothyroidism, unspecified: Secondary | ICD-10-CM | POA: Diagnosis not present

## 2022-05-07 DIAGNOSIS — E782 Mixed hyperlipidemia: Secondary | ICD-10-CM | POA: Diagnosis not present

## 2022-05-07 DIAGNOSIS — I10 Essential (primary) hypertension: Secondary | ICD-10-CM | POA: Diagnosis not present

## 2022-05-12 DIAGNOSIS — E663 Overweight: Secondary | ICD-10-CM | POA: Diagnosis not present

## 2022-05-12 DIAGNOSIS — R5383 Other fatigue: Secondary | ICD-10-CM | POA: Diagnosis not present

## 2022-05-12 DIAGNOSIS — E782 Mixed hyperlipidemia: Secondary | ICD-10-CM | POA: Diagnosis not present

## 2022-05-12 DIAGNOSIS — M25562 Pain in left knee: Secondary | ICD-10-CM | POA: Diagnosis not present

## 2022-05-12 DIAGNOSIS — N1831 Chronic kidney disease, stage 3a: Secondary | ICD-10-CM | POA: Diagnosis not present

## 2022-05-12 DIAGNOSIS — M25561 Pain in right knee: Secondary | ICD-10-CM | POA: Diagnosis not present

## 2022-05-12 DIAGNOSIS — R7401 Elevation of levels of liver transaminase levels: Secondary | ICD-10-CM | POA: Diagnosis not present

## 2022-05-12 DIAGNOSIS — E039 Hypothyroidism, unspecified: Secondary | ICD-10-CM | POA: Diagnosis not present

## 2022-05-12 DIAGNOSIS — I10 Essential (primary) hypertension: Secondary | ICD-10-CM | POA: Diagnosis not present

## 2022-05-12 DIAGNOSIS — Z0000 Encounter for general adult medical examination without abnormal findings: Secondary | ICD-10-CM | POA: Diagnosis not present

## 2022-05-12 DIAGNOSIS — R7303 Prediabetes: Secondary | ICD-10-CM | POA: Diagnosis not present

## 2022-05-12 DIAGNOSIS — E559 Vitamin D deficiency, unspecified: Secondary | ICD-10-CM | POA: Diagnosis not present

## 2022-06-22 DIAGNOSIS — H6693 Otitis media, unspecified, bilateral: Secondary | ICD-10-CM | POA: Diagnosis not present

## 2022-08-14 ENCOUNTER — Ambulatory Visit
Admission: EM | Admit: 2022-08-14 | Discharge: 2022-08-14 | Disposition: A | Discharge status: Home / Self Care | Payer: Medicare HMO | Attending: Urgent Care | Admitting: Urgent Care

## 2022-08-14 DIAGNOSIS — R059 Cough, unspecified: Secondary | ICD-10-CM | POA: Diagnosis not present

## 2022-08-14 DIAGNOSIS — B349 Viral infection, unspecified: Secondary | ICD-10-CM | POA: Diagnosis not present

## 2022-08-14 DIAGNOSIS — H65192 Other acute nonsuppurative otitis media, left ear: Secondary | ICD-10-CM

## 2022-08-14 DIAGNOSIS — J309 Allergic rhinitis, unspecified: Secondary | ICD-10-CM

## 2022-08-14 DIAGNOSIS — Z1152 Encounter for screening for COVID-19: Secondary | ICD-10-CM | POA: Diagnosis not present

## 2022-08-14 LAB — RESP PANEL BY RT-PCR (FLU A&B, COVID) ARPGX2
Influenza A by PCR: NEGATIVE
Influenza B by PCR: NEGATIVE
SARS Coronavirus 2 by RT PCR: NEGATIVE

## 2022-08-14 MED ORDER — PROMETHAZINE-DM 6.25-15 MG/5ML PO SYRP: 2.5000 mL | ORAL_SOLUTION | Freq: Three times a day (TID) | ORAL | 0 refills | Status: DC | PRN

## 2022-08-14 MED ORDER — PSEUDOEPHEDRINE HCL 30 MG PO TABS: 30.0000 mg | ORAL_TABLET | Freq: Three times a day (TID) | ORAL | 0 refills | Status: DC | PRN

## 2022-08-14 MED ORDER — CETIRIZINE HCL 10 MG PO TABS: 10.0000 mg | ORAL_TABLET | Freq: Every day | ORAL | 0 refills | Status: DC

## 2022-08-14 MED ORDER — AMOXICILLIN 875 MG PO TABS: 875.0000 mg | ORAL_TABLET | Freq: Two times a day (BID) | ORAL | 0 refills | Status: DC

## 2022-08-14 NOTE — ED Provider Notes (Signed)
[facility]  Note:  This document was prepared using Dragon voice recognition software and may include unintentional dictation errors.  MRN: [id number] DOB: September 26, 1951  Subjective:   [name] is a 71 y.o. female presenting for 2-day history of acute onset left ear pain, sinus congestion, sinus drainage, bloody mucus from the nose, throat pain, coughing.  Has been using Aleve and over-the-counter ear solutions without any relief.  She would like a COVID flu test.  No chest pain, shortness of breath or wheezing.  No current facility-administered medications for this encounter.  Current Outpatient Medications:    Ascorbic Acid (VITAMIN C) 100 MG tablet, Take 100 mg by mouth daily., Disp: , Rfl:    atorvastatin (LIPITOR) 10 MG tablet, Take 10 mg by mouth at bedtime., Disp: , Rfl:    Cholecalciferol (VITAMIN D3) 50 MCG (2000 UT) TABS, Take 2,000 Units by mouth daily., Disp: , Rfl:    HYDROcodone-acetaminophen (NORCO/VICODIN) 5-325 MG tablet, Take 1 tablet by mouth daily as needed for severe pain. , Disp: , Rfl:    levothyroxine (SYNTHROID, LEVOTHROID) 88 MCG tablet, Take 1 tablet (88 mcg total) by mouth daily., Disp: 90 tablet, Rfl: 3   lisinopril-hydrochlorothiazide (PRINZIDE,ZESTORETIC) 10-12.5 MG tablet, Take 1 tablet by mouth daily., Disp: 90 tablet, Rfl: 2   meloxicam (MOBIC) 15 MG tablet, Take 1 tablet by mouth daily., Disp: , Rfl:    Vitamin D, Ergocalciferol, (DRISDOL) 1.25 MG (50000 UNIT) CAPS capsule, Take 50,000 Units by mouth once a week., Disp: , Rfl:    No Known Allergies  Past Medical History:  Diagnosis Date   Headache(784.0)    HTN (hypertension)    Hypothyroidism    Internal hemorrhoids    Melanosis coli    Tubulovillous adenoma 06/2008   1cm flat ascending colon     Past Surgical History:  Procedure Laterality Date   CATARACT EXTRACTION  2011   bilateral   COLONOSCOPY  06/04/08   moderate internal hemorrhoids/melanosis coli/tubulovillous  adenoma 1cm flat   COLONOSCOPY  12/17/2011   Procedure: COLONOSCOPY;  Surgeon: Dorothyann Peng, MD;  [facility];  Service: Endoscopy;  Laterality: N/A;  12:30   COLONOSCOPY N/A 04/11/2020   Procedure: COLONOSCOPY;  Surgeon: Daneil Dolin, MD;  Location: [facility];  Service: Endoscopy;  Laterality: N/A;  10:45am   DIAGNOSTIC MAMMOGRAM     TUBAL LIGATION  1977   VAGINAL DELIVERY      Family History  Problem Relation Age of Onset   Breast cancer Sister        two, one deceased   Cancer Sister        breast ca   Pancreatic cancer Mother        deceased, 29   Heart disease Mother    Hypertension Mother    Colon cancer Neg Hx    Lung cancer Neg Hx    Ovarian cancer Neg Hx     Social History   Tobacco Use   Smoking status: Never   Smokeless tobacco: Never  Vaping Use   Vaping Use: Never used  Substance Use Topics   Alcohol use: No   Drug use: No    ROS   Objective:   Vitals: BP 132/68 (BP Location: Right Arm)   Pulse 76   Temp 97.7 F (36.5 C) (Oral)   Resp 18   SpO2 97%   Physical Exam Constitutional:      General: She is not in acute distress.  Appearance: Normal appearance. She is well-developed and normal weight. She is not ill-appearing, toxic-appearing or diaphoretic.  HENT:     Head: Normocephalic and atraumatic.     Right Ear: Tympanic membrane, ear canal and external ear normal. No drainage or tenderness. No middle ear effusion. There is no impacted cerumen. Tympanic membrane is not erythematous.     Left Ear: Ear canal and external ear normal. No drainage or tenderness. A middle ear effusion is present. There is no impacted cerumen. Tympanic membrane is erythematous.     Nose: Nose normal. No congestion or rhinorrhea.     Mouth/Throat:     Mouth: Mucous membranes are moist. No oral lesions.     Pharynx: No pharyngeal swelling, oropharyngeal exudate, posterior oropharyngeal erythema or uvula swelling.     Tonsils: No tonsillar exudate  or tonsillar abscesses.  Eyes:     General: No scleral icterus.       Right eye: No discharge.        Left eye: No discharge.     Extraocular Movements: Extraocular movements intact.     Right eye: Normal extraocular motion.     Left eye: Normal extraocular motion.     Conjunctiva/sclera: Conjunctivae normal.  Cardiovascular:     Rate and Rhythm: Normal rate and regular rhythm.     Heart sounds: Normal heart sounds. No murmur heard.    No friction rub. No gallop.  Pulmonary:     Effort: Pulmonary effort is normal. No respiratory distress.     Breath sounds: No stridor. No wheezing, rhonchi or rales.  Chest:     Chest wall: No tenderness.  Musculoskeletal:     Cervical back: Normal range of motion and neck supple.  Lymphadenopathy:     Cervical: No cervical adenopathy.  Skin:    General: Skin is warm and dry.  Neurological:     General: No focal deficit present.     Mental Status: She is alert and oriented to person, place, and time.  Psychiatric:        Mood and Affect: Mood normal.        Behavior: Behavior normal.     Assessment and Plan :   PDMP not reviewed this encounter.  1. Other non-recurrent acute nonsuppurative otitis media of left ear   2. Acute viral syndrome   3. Allergic rhinitis, unspecified seasonality, unspecified trigger    COVID and flu test pending.  Suspect an acute viral syndrome with a secondary otitis media.  Start amoxicillin, use supportive care otherwise. Deferred imaging given clear cardiopulmonary exam, hemodynamically stable vital signs. Counseled patient on potential for adverse effects with medications prescribed/recommended today, ER and return-to-clinic precautions discussed, patient verbalized understanding.    Jaynee Eagles, [place] 08/14/22 [phone]

## 2022-08-14 NOTE — ED Triage Notes (Signed)
Pt complains of flu like symptoms. Reports of ear infection, nose bleeds, sore throat, sinus drainage. Symptoms began 2 days ago. Taken alieve and salt pack  and castor oil which provide some relief.

## 2022-09-09 ENCOUNTER — Other Ambulatory Visit (HOSPITAL_COMMUNITY): Payer: Self-pay | Admitting: Internal Medicine

## 2022-09-09 DIAGNOSIS — Z1231 Encounter for screening mammogram for malignant neoplasm of breast: Secondary | ICD-10-CM

## 2022-10-21 ENCOUNTER — Ambulatory Visit (HOSPITAL_COMMUNITY)
Admission: RE | Admit: 2022-10-21 | Discharge: 2022-10-21 | Disposition: A | Discharge status: Home / Self Care | Payer: Medicare HMO | Source: Ambulatory Visit | Attending: Internal Medicine | Admitting: Internal Medicine

## 2022-10-21 DIAGNOSIS — Z1231 Encounter for screening mammogram for malignant neoplasm of breast: Secondary | ICD-10-CM | POA: Diagnosis not present

## 2022-11-10 DIAGNOSIS — E559 Vitamin D deficiency, unspecified: Secondary | ICD-10-CM | POA: Diagnosis not present

## 2022-11-10 DIAGNOSIS — R7303 Prediabetes: Secondary | ICD-10-CM | POA: Diagnosis not present

## 2022-11-10 DIAGNOSIS — I10 Essential (primary) hypertension: Secondary | ICD-10-CM | POA: Diagnosis not present

## 2022-11-10 DIAGNOSIS — E039 Hypothyroidism, unspecified: Secondary | ICD-10-CM | POA: Diagnosis not present

## 2022-11-17 DIAGNOSIS — R7303 Prediabetes: Secondary | ICD-10-CM | POA: Diagnosis not present

## 2022-11-17 DIAGNOSIS — I10 Essential (primary) hypertension: Secondary | ICD-10-CM | POA: Diagnosis not present

## 2022-11-17 DIAGNOSIS — M25561 Pain in right knee: Secondary | ICD-10-CM | POA: Diagnosis not present

## 2022-11-17 DIAGNOSIS — E559 Vitamin D deficiency, unspecified: Secondary | ICD-10-CM | POA: Diagnosis not present

## 2022-11-17 DIAGNOSIS — R062 Wheezing: Secondary | ICD-10-CM | POA: Diagnosis not present

## 2022-11-17 DIAGNOSIS — N1831 Chronic kidney disease, stage 3a: Secondary | ICD-10-CM | POA: Diagnosis not present

## 2022-11-17 DIAGNOSIS — R040 Epistaxis: Secondary | ICD-10-CM | POA: Diagnosis not present

## 2022-11-17 DIAGNOSIS — Z0000 Encounter for general adult medical examination without abnormal findings: Secondary | ICD-10-CM | POA: Diagnosis not present

## 2022-11-17 DIAGNOSIS — I129 Hypertensive chronic kidney disease with stage 1 through stage 4 chronic kidney disease, or unspecified chronic kidney disease: Secondary | ICD-10-CM | POA: Diagnosis not present

## 2022-11-17 DIAGNOSIS — E039 Hypothyroidism, unspecified: Secondary | ICD-10-CM | POA: Diagnosis not present

## 2022-11-17 DIAGNOSIS — E782 Mixed hyperlipidemia: Secondary | ICD-10-CM | POA: Diagnosis not present

## 2022-11-17 DIAGNOSIS — Z0001 Encounter for general adult medical examination with abnormal findings: Secondary | ICD-10-CM | POA: Diagnosis not present

## 2023-02-15 ENCOUNTER — Ambulatory Visit
Admission: EM | Admit: 2023-02-15 | Discharge: 2023-02-15 | Disposition: A | Discharge status: Home / Self Care | Payer: Medicare HMO | Attending: Family Medicine | Admitting: Family Medicine

## 2023-02-15 DIAGNOSIS — Z1152 Encounter for screening for COVID-19: Secondary | ICD-10-CM | POA: Insufficient documentation

## 2023-02-15 DIAGNOSIS — J069 Acute upper respiratory infection, unspecified: Secondary | ICD-10-CM

## 2023-02-15 LAB — POCT INFLUENZA A/B
Influenza A, POC: NEGATIVE
Influenza B, POC: NEGATIVE

## 2023-02-15 MED ORDER — HYDROCODONE BIT-HOMATROP MBR 5-1.5 MG/5ML PO SOLN: 5.0000 mL | Freq: Four times a day (QID) | ORAL | 0 refills | Status: DC | PRN

## 2023-02-15 NOTE — ED Provider Notes (Signed)
[facility]   [id number] 02/15/23 Arrival Time: 0803  ASSESSMENT & PLAN:  1. Viral URI with cough    Discussed typical duration of likely viral illness. COVID testing sent at her request. OTC symptom care as needed.  New Prescriptions   HYDROCODONE BIT-HOMATROPINE (HYCODAN) 5-1.5 MG/5ML SYRUP    Take 5 mLs by mouth every 6 (six) hours as needed for cough.   Results for orders placed or performed during the hospital encounter of 02/15/23  POCT Influenza A/B  Result Value Ref Range   Influenza A, POC Negative Negative   Influenza B, POC Negative Negative      Discharge Instructions      Be aware, your cough medication may cause drowsiness. Please do not drive, operate heavy machinery or make important decisions while on this medication, it can cloud your judgement.  You have been tested for COVID-19 today. If your test returns positive, you will receive a phone call from [facility] regarding your results. Negative test results are not called. Both positive and negative results area always visible on MyChart. If you do not have a MyChart account, sign up instructions are provided in your discharge papers. Please do not hesitate to contact us should you have questions or concerns.        Follow-up Information     Benita Stabile, MD.   Specialty: Internal Medicine Why: If worsening or failing to improve as anticipated. Contact information: [address] [place] [facility] [address] [phone]                 Reviewed expectations re: course of current medical issues. Questions answered. Outlined signs and symptoms indicating need for more acute intervention. Understanding verbalized. After Visit Summary given.   SUBJECTIVE: History from: Patient. [name] is a 72 y.o. female. Pt reports she has she has body chills, phlegm, headache, sweats body aches, pins and needles feeling in right low back and side x 2 days. Took coricidin HBP but  only slight relief.  Desires COVID testing. Denies: difficulty breathing. Normal PO intake without n/v/d.  OBJECTIVE:  Vitals:   02/15/23 0819  BP: 102/67  Pulse: 97  Resp: 18  Temp: 98 F (36.7 C)  TempSrc: Oral  SpO2: 93%    General appearance: alert; no distress Eyes: PERRLA; EOMI; conjunctiva normal HENT: [place]; AT; with nasal congestion Neck: supple  Lungs: speaks full sentences without difficulty; unlabored; CTAB Extremities: no edema Skin: warm and dry Neurologic: normal gait Psychological: alert and cooperative; normal mood and affect  Labs: Results for orders placed or performed during the hospital encounter of 02/15/23  POCT Influenza A/B  Result Value Ref Range   Influenza A, POC Negative Negative   Influenza B, POC Negative Negative   Labs Reviewed  SARS CORONAVIRUS 2 (TAT 6-24 HRS)  POCT INFLUENZA A/B    No Known Allergies  Past Medical History:  Diagnosis Date   Headache(784.0)    HTN (hypertension)    Hypothyroidism    Internal hemorrhoids    Melanosis coli    Tubulovillous adenoma 06/2008   1cm flat ascending colon   Social History   Socioeconomic History   Marital status: Widowed    Spouse name: Not on file   Number of children: 1   Years of education: Not on file   Highest education level: Not on file  Occupational History   Occupation: Customer service    Employer: [employer]  Tobacco Use   Smoking status:  Never   Smokeless tobacco: Never  Vaping Use   Vaping Use: Never used  Substance and Sexual Activity   Alcohol use: No   Drug use: No   Sexual activity: Yes    Birth control/protection: Post-menopausal  Other Topics Concern   Not on file  Social History Narrative   Not on file   Social Determinants of Health   Financial Resource Strain: Not on file  Food Insecurity: Not on file  Transportation Needs: Not on file  Physical Activity: Not on file  Stress: Not on file  Social Connections: Not on file  Intimate Partner  Violence: Not on file   Family History  Problem Relation Age of Onset   Breast cancer Sister        two, one deceased   Cancer Sister        breast ca   Pancreatic cancer Mother        deceased, 37   Heart disease Mother    Hypertension Mother    Colon cancer Neg Hx    Lung cancer Neg Hx    Ovarian cancer Neg Hx    Past Surgical History:  Procedure Laterality Date   CATARACT EXTRACTION  2011   bilateral   COLONOSCOPY  06/04/08   moderate internal hemorrhoids/melanosis coli/tubulovillous adenoma 1cm flat   COLONOSCOPY  12/17/2011   Procedure: COLONOSCOPY;  Surgeon: Arlyce Harman, MD;  [facility];  Service: Endoscopy;  Laterality: N/A;  12:30   COLONOSCOPY N/A 04/11/2020   Procedure: COLONOSCOPY;  Surgeon: Corbin Ade, MD;  [facility];  Service: Endoscopy;  Laterality: N/A;  10:45am   DIAGNOSTIC MAMMOGRAM     TUBAL LIGATION  1977   VAGINAL DELIVERY       Mardella Layman, MD 02/15/23 [phone]

## 2023-02-15 NOTE — Discharge Instructions (Addendum)
Be aware, your cough medication may cause drowsiness. Please do not drive, operate heavy machinery or make important decisions while on this medication, it can cloud your judgement.  You have been tested for COVID-19 today. If your test returns positive, you will receive a phone call from  regarding your results. Negative test results are not called. Both positive and negative results area always visible on MyChart. If you do not have a MyChart account, sign up instructions are provided in your discharge papers. Please do not hesitate to contact us should you have questions or concerns.  

## 2023-02-15 NOTE — ED Triage Notes (Signed)
Pt reports she has she has body chills, phlegm, headache, sweats body aches, pins and needles feeling in right low back and side x 2 days. Took coricidin HBP but only slight relief.

## 2023-02-16 LAB — SARS CORONAVIRUS 2 (TAT 6-24 HRS): SARS Coronavirus 2: NEGATIVE

## 2023-02-23 ENCOUNTER — Other Ambulatory Visit (HOSPITAL_COMMUNITY): Payer: Self-pay | Admitting: Internal Medicine

## 2023-02-23 ENCOUNTER — Ambulatory Visit (HOSPITAL_COMMUNITY)
Admission: RE | Admit: 2023-02-23 | Discharge: 2023-02-23 | Disposition: A | Discharge status: Home / Self Care | Payer: Medicare HMO | Source: Ambulatory Visit | Attending: Internal Medicine | Admitting: Internal Medicine

## 2023-02-23 DIAGNOSIS — J069 Acute upper respiratory infection, unspecified: Secondary | ICD-10-CM | POA: Diagnosis not present

## 2023-02-23 DIAGNOSIS — R0602 Shortness of breath: Secondary | ICD-10-CM | POA: Diagnosis not present

## 2023-03-03 DIAGNOSIS — M25562 Pain in left knee: Secondary | ICD-10-CM | POA: Diagnosis not present

## 2023-04-13 DIAGNOSIS — M1712 Unilateral primary osteoarthritis, left knee: Secondary | ICD-10-CM | POA: Diagnosis not present

## 2023-04-21 DIAGNOSIS — M1712 Unilateral primary osteoarthritis, left knee: Secondary | ICD-10-CM | POA: Diagnosis not present

## 2023-04-27 DIAGNOSIS — M1712 Unilateral primary osteoarthritis, left knee: Secondary | ICD-10-CM | POA: Diagnosis not present

## 2023-04-28 ENCOUNTER — Ambulatory Visit (HOSPITAL_COMMUNITY)
Admission: RE | Admit: 2023-04-28 | Discharge: 2023-04-28 | Disposition: A | Discharge status: Home / Self Care | Payer: Medicare HMO | Source: Ambulatory Visit | Attending: Family Medicine | Admitting: Family Medicine

## 2023-04-28 ENCOUNTER — Other Ambulatory Visit (HOSPITAL_COMMUNITY): Payer: Self-pay | Admitting: Family Medicine

## 2023-04-28 DIAGNOSIS — J069 Acute upper respiratory infection, unspecified: Secondary | ICD-10-CM | POA: Diagnosis not present

## 2023-04-28 DIAGNOSIS — R0602 Shortness of breath: Secondary | ICD-10-CM | POA: Diagnosis not present

## 2023-04-28 DIAGNOSIS — R059 Cough, unspecified: Secondary | ICD-10-CM | POA: Diagnosis not present

## 2023-05-12 DIAGNOSIS — E039 Hypothyroidism, unspecified: Secondary | ICD-10-CM | POA: Diagnosis not present

## 2023-05-12 DIAGNOSIS — R7401 Elevation of levels of liver transaminase levels: Secondary | ICD-10-CM | POA: Diagnosis not present

## 2023-05-23 ENCOUNTER — Other Ambulatory Visit (HOSPITAL_COMMUNITY): Payer: Self-pay | Admitting: Family Medicine

## 2023-05-23 DIAGNOSIS — R7303 Prediabetes: Secondary | ICD-10-CM | POA: Diagnosis not present

## 2023-05-23 DIAGNOSIS — Z0000 Encounter for general adult medical examination without abnormal findings: Secondary | ICD-10-CM | POA: Diagnosis not present

## 2023-05-23 DIAGNOSIS — N1831 Chronic kidney disease, stage 3a: Secondary | ICD-10-CM | POA: Diagnosis not present

## 2023-05-23 DIAGNOSIS — R7401 Elevation of levels of liver transaminase levels: Secondary | ICD-10-CM

## 2023-05-23 DIAGNOSIS — E559 Vitamin D deficiency, unspecified: Secondary | ICD-10-CM | POA: Diagnosis not present

## 2023-05-23 DIAGNOSIS — I129 Hypertensive chronic kidney disease with stage 1 through stage 4 chronic kidney disease, or unspecified chronic kidney disease: Secondary | ICD-10-CM | POA: Diagnosis not present

## 2023-05-23 DIAGNOSIS — E039 Hypothyroidism, unspecified: Secondary | ICD-10-CM | POA: Diagnosis not present

## 2023-05-23 DIAGNOSIS — M25561 Pain in right knee: Secondary | ICD-10-CM | POA: Diagnosis not present

## 2023-05-23 DIAGNOSIS — M25562 Pain in left knee: Secondary | ICD-10-CM | POA: Diagnosis not present

## 2023-05-23 DIAGNOSIS — E782 Mixed hyperlipidemia: Secondary | ICD-10-CM | POA: Diagnosis not present

## 2023-05-23 DIAGNOSIS — R062 Wheezing: Secondary | ICD-10-CM | POA: Diagnosis not present

## 2023-05-23 DIAGNOSIS — I10 Essential (primary) hypertension: Secondary | ICD-10-CM | POA: Diagnosis not present

## 2023-06-03 ENCOUNTER — Ambulatory Visit (HOSPITAL_COMMUNITY)
Admission: RE | Admit: 2023-06-03 | Discharge: 2023-06-03 | Disposition: A | Discharge status: Home / Self Care | Payer: Medicare HMO | Source: Ambulatory Visit | Attending: Family Medicine | Admitting: Family Medicine

## 2023-06-03 DIAGNOSIS — R7401 Elevation of levels of liver transaminase levels: Secondary | ICD-10-CM | POA: Diagnosis not present

## 2023-07-08 DIAGNOSIS — M1712 Unilateral primary osteoarthritis, left knee: Secondary | ICD-10-CM | POA: Diagnosis not present

## 2023-08-25 DIAGNOSIS — M25562 Pain in left knee: Secondary | ICD-10-CM | POA: Diagnosis not present

## 2023-08-25 DIAGNOSIS — M1712 Unilateral primary osteoarthritis, left knee: Secondary | ICD-10-CM | POA: Diagnosis not present

## 2023-08-26 DIAGNOSIS — Z008 Encounter for other general examination: Secondary | ICD-10-CM | POA: Diagnosis not present

## 2023-09-21 ENCOUNTER — Ambulatory Visit: Payer: Medicare HMO | Admitting: Adult Health

## 2023-09-21 ENCOUNTER — Other Ambulatory Visit (HOSPITAL_COMMUNITY)
Admission: RE | Admit: 2023-09-21 | Discharge: 2023-09-21 | Disposition: A | Discharge status: Home / Self Care | Payer: Medicare HMO | Source: Ambulatory Visit | Attending: Adult Health | Admitting: Adult Health

## 2023-09-21 ENCOUNTER — Encounter: Payer: Self-pay | Admitting: Adult Health

## 2023-09-21 VITALS — BP 136/79 | HR 68 | Ht 66.0 in | Wt 176.0 lb

## 2023-09-21 DIAGNOSIS — R195 Other fecal abnormalities: Secondary | ICD-10-CM | POA: Diagnosis not present

## 2023-09-21 DIAGNOSIS — Z01419 Encounter for gynecological examination (general) (routine) without abnormal findings: Secondary | ICD-10-CM

## 2023-09-21 DIAGNOSIS — Z1331 Encounter for screening for depression: Secondary | ICD-10-CM

## 2023-09-21 DIAGNOSIS — N8110 Cystocele, unspecified: Secondary | ICD-10-CM | POA: Diagnosis not present

## 2023-09-21 DIAGNOSIS — Z1211 Encounter for screening for malignant neoplasm of colon: Secondary | ICD-10-CM | POA: Diagnosis not present

## 2023-09-21 DIAGNOSIS — Z0000 Encounter for general adult medical examination without abnormal findings: Secondary | ICD-10-CM

## 2023-09-21 DIAGNOSIS — N816 Rectocele: Secondary | ICD-10-CM

## 2023-09-21 DIAGNOSIS — K5900 Constipation, unspecified: Secondary | ICD-10-CM

## 2023-09-21 LAB — HEMOCCULT GUIAC POC 1CARD (OFFICE): Fecal Occult Blood, POC: POSITIVE — AB

## 2023-09-21 NOTE — Progress Notes (Signed)
Patient ID: [name], female   DOB: 1951/10/22, 72 y.o.   MRN: [id number] History of Present Illness: [name] is a 72 year old black female, widowed, PM in for a well woman gyn exam and pap. She is still working 2 days a week at [employer].   PCP is Dr Margo Aye.   Current Medications, Allergies, Past Medical History, Past Surgical History, Family History and Social History were reviewed in [employer] record.     Review of Systems: Patient denies any headaches, hearing loss, fatigue, blurred vision, shortness of breath, chest pain, abdominal pain, problems with urination, or intercourse(not active). No joint pain or mood swings.  Has constipation at times Having left knee replaced 09/27/23.    Physical Exam:BP 136/79 (BP Location: Right Arm, Patient Position: Sitting, Cuff Size: Normal)   Pulse 68   Ht 5\' 6"  (1.676 m)   Wt 176 lb (79.8 kg)   BMI 28.41 kg/m   General:  Well developed, well nourished, no acute distress Skin:  Warm and dry Neck:  Midline trachea, normal thyroid, good ROM, no lymphadenopathy, no carotid bruits heard Lungs; Clear to auscultation bilaterally Breast:  No dominant palpable mass, retraction, or nipple discharge Cardiovascular: Regular rate and rhythm Abdomen:  Soft, non tender, no hepatosplenomegaly Pelvic:  External genitalia is normal in appearance, no lesions.  The vagina is pale, has cystocele. Urethra has no lesions or masses. The cervix is smooth, pap with HR HPV genotyping performed.  Uterus is felt to be normal size, shape, and contour.  No adnexal masses or tenderness noted.Bladder is non tender, no masses felt. Rectal: Good sphincter tone, no polyps, or hemorrhoids felt.  Hemoccult positive.+ rectocele Extremities/musculoskeletal:  No swelling or varicosities noted, no clubbing or cyanosis Psych:  No mood changes, alert and cooperative,seems happy AA is 1 Fall risk is low    09/21/2023    1:31 PM 01/01/2019   11:03 AM 09/05/2018     9:47 AM  Depression screen PHQ 2/9  Decreased Interest 0 0   Down, Depressed, Hopeless 0 0   PHQ - 2 Score 0 0   Altered sleeping 0    Tired, decreased energy 3    Change in appetite 0    Feeling bad or failure about yourself  0    Trouble concentrating 0    Moving slowly or fidgety/restless 0    Suicidal thoughts 0    PHQ-9 Score 3       Information is confidential and restricted. Go to Review Flowsheets to unlock data.       09/21/2023    1:32 PM  GAD 7 : Generalized Anxiety Score  Nervous, Anxious, on Edge 1  Control/stop worrying 0  Worry too much - different things 0  Trouble relaxing 0  Restless 0  Easily annoyed or irritable 0  Afraid - awful might happen 0  Total GAD 7 Score 1      Upstream - 09/21/23 1346       Pregnancy Intention Screening   Does the patient want to become pregnant in the next year? N/A    Does the patient's partner want to become pregnant in the next year? N/A    Would the patient like to discuss contraceptive options today? N/A      Contraception Wrap Up   Current Method Female Sterilization   PM   End Method Female Sterilization   PM   Contraception Counseling Provided No    How was the end  contraceptive method provided? N/A            Examination chaperoned by Faith Rogue LPN   Impression and Plan: 1. Encounter for gynecological examination with Papanicolaou smear of cervix Pap sent Last pap if normal Mammogram was negative 10/21/22 Colonoscopy per GI Labs with PCP  2. Encounter for screening fecal occult blood testing Hemoccult was +  3. Positive fecal occult blood test 3 cards sent home with pt to do and return  4. Cystocele with rectocele  5. Constipation, unspecified constipation type Try Senakot S

## 2023-09-27 DIAGNOSIS — G8918 Other acute postprocedural pain: Secondary | ICD-10-CM | POA: Diagnosis not present

## 2023-09-27 DIAGNOSIS — M25762 Osteophyte, left knee: Secondary | ICD-10-CM | POA: Diagnosis not present

## 2023-09-27 DIAGNOSIS — M1712 Unilateral primary osteoarthritis, left knee: Secondary | ICD-10-CM | POA: Diagnosis not present

## 2023-09-27 LAB — CYTOLOGY - PAP
Adequacy: ABSENT
Comment: NEGATIVE
Diagnosis: NEGATIVE
High risk HPV: NEGATIVE

## 2023-10-03 DIAGNOSIS — M25662 Stiffness of left knee, not elsewhere classified: Secondary | ICD-10-CM | POA: Diagnosis not present

## 2023-10-03 DIAGNOSIS — M25562 Pain in left knee: Secondary | ICD-10-CM | POA: Diagnosis not present

## 2023-10-05 DIAGNOSIS — M25562 Pain in left knee: Secondary | ICD-10-CM | POA: Diagnosis not present

## 2023-10-05 DIAGNOSIS — M25662 Stiffness of left knee, not elsewhere classified: Secondary | ICD-10-CM | POA: Diagnosis not present

## 2023-10-07 DIAGNOSIS — M25662 Stiffness of left knee, not elsewhere classified: Secondary | ICD-10-CM | POA: Diagnosis not present

## 2023-10-07 DIAGNOSIS — M25562 Pain in left knee: Secondary | ICD-10-CM | POA: Diagnosis not present

## 2023-10-10 DIAGNOSIS — M25662 Stiffness of left knee, not elsewhere classified: Secondary | ICD-10-CM | POA: Diagnosis not present

## 2023-10-10 DIAGNOSIS — M25562 Pain in left knee: Secondary | ICD-10-CM | POA: Diagnosis not present

## 2023-10-12 DIAGNOSIS — M25562 Pain in left knee: Secondary | ICD-10-CM | POA: Diagnosis not present

## 2023-10-12 DIAGNOSIS — M25662 Stiffness of left knee, not elsewhere classified: Secondary | ICD-10-CM | POA: Diagnosis not present

## 2023-10-14 DIAGNOSIS — M25562 Pain in left knee: Secondary | ICD-10-CM | POA: Diagnosis not present

## 2023-10-14 DIAGNOSIS — M25662 Stiffness of left knee, not elsewhere classified: Secondary | ICD-10-CM | POA: Diagnosis not present

## 2023-10-19 DIAGNOSIS — M25662 Stiffness of left knee, not elsewhere classified: Secondary | ICD-10-CM | POA: Diagnosis not present

## 2023-10-19 DIAGNOSIS — M25562 Pain in left knee: Secondary | ICD-10-CM | POA: Diagnosis not present

## 2023-10-21 DIAGNOSIS — M25662 Stiffness of left knee, not elsewhere classified: Secondary | ICD-10-CM | POA: Diagnosis not present

## 2023-10-21 DIAGNOSIS — M25562 Pain in left knee: Secondary | ICD-10-CM | POA: Diagnosis not present

## 2023-10-28 DIAGNOSIS — M25562 Pain in left knee: Secondary | ICD-10-CM | POA: Diagnosis not present

## 2023-10-28 DIAGNOSIS — M25662 Stiffness of left knee, not elsewhere classified: Secondary | ICD-10-CM | POA: Diagnosis not present

## 2023-11-01 DIAGNOSIS — M25662 Stiffness of left knee, not elsewhere classified: Secondary | ICD-10-CM | POA: Diagnosis not present

## 2023-11-01 DIAGNOSIS — M25562 Pain in left knee: Secondary | ICD-10-CM | POA: Diagnosis not present

## 2023-11-01 DIAGNOSIS — Z5189 Encounter for other specified aftercare: Secondary | ICD-10-CM | POA: Diagnosis not present

## 2023-11-23 ENCOUNTER — Other Ambulatory Visit (HOSPITAL_COMMUNITY): Payer: Self-pay | Admitting: Nurse Practitioner

## 2023-11-23 DIAGNOSIS — R7401 Elevation of levels of liver transaminase levels: Secondary | ICD-10-CM | POA: Diagnosis not present

## 2023-11-23 DIAGNOSIS — N1831 Chronic kidney disease, stage 3a: Secondary | ICD-10-CM | POA: Diagnosis not present

## 2023-11-23 DIAGNOSIS — L989 Disorder of the skin and subcutaneous tissue, unspecified: Secondary | ICD-10-CM | POA: Diagnosis not present

## 2023-11-23 DIAGNOSIS — R7303 Prediabetes: Secondary | ICD-10-CM | POA: Diagnosis not present

## 2023-11-23 DIAGNOSIS — I10 Essential (primary) hypertension: Secondary | ICD-10-CM | POA: Diagnosis not present

## 2023-11-23 DIAGNOSIS — M25562 Pain in left knee: Secondary | ICD-10-CM | POA: Diagnosis not present

## 2023-11-23 DIAGNOSIS — Z0000 Encounter for general adult medical examination without abnormal findings: Secondary | ICD-10-CM | POA: Diagnosis not present

## 2023-11-23 DIAGNOSIS — Z0001 Encounter for general adult medical examination with abnormal findings: Secondary | ICD-10-CM | POA: Diagnosis not present

## 2023-11-23 DIAGNOSIS — M25561 Pain in right knee: Secondary | ICD-10-CM | POA: Diagnosis not present

## 2023-11-23 DIAGNOSIS — E782 Mixed hyperlipidemia: Secondary | ICD-10-CM | POA: Diagnosis not present

## 2023-11-23 DIAGNOSIS — E039 Hypothyroidism, unspecified: Secondary | ICD-10-CM | POA: Diagnosis not present

## 2023-11-23 DIAGNOSIS — E559 Vitamin D deficiency, unspecified: Secondary | ICD-10-CM | POA: Diagnosis not present

## 2023-11-23 DIAGNOSIS — Z1231 Encounter for screening mammogram for malignant neoplasm of breast: Secondary | ICD-10-CM

## 2023-12-01 ENCOUNTER — Inpatient Hospital Stay (HOSPITAL_COMMUNITY): Admission: RE | Admit: 2023-12-01 | Payer: Medicare HMO | Source: Ambulatory Visit

## 2023-12-05 DIAGNOSIS — Z789 Other specified health status: Secondary | ICD-10-CM | POA: Diagnosis not present

## 2023-12-05 DIAGNOSIS — R208 Other disturbances of skin sensation: Secondary | ICD-10-CM | POA: Diagnosis not present

## 2023-12-05 DIAGNOSIS — L2989 Other pruritus: Secondary | ICD-10-CM | POA: Diagnosis not present

## 2023-12-05 DIAGNOSIS — L910 Hypertrophic scar: Secondary | ICD-10-CM | POA: Diagnosis not present

## 2023-12-05 DIAGNOSIS — L538 Other specified erythematous conditions: Secondary | ICD-10-CM | POA: Diagnosis not present

## 2023-12-05 DIAGNOSIS — L280 Lichen simplex chronicus: Secondary | ICD-10-CM | POA: Diagnosis not present

## 2023-12-05 DIAGNOSIS — D485 Neoplasm of uncertain behavior of skin: Secondary | ICD-10-CM | POA: Diagnosis not present

## 2023-12-05 DIAGNOSIS — R21 Rash and other nonspecific skin eruption: Secondary | ICD-10-CM | POA: Diagnosis not present

## 2023-12-14 ENCOUNTER — Ambulatory Visit (HOSPITAL_COMMUNITY)
Admission: RE | Admit: 2023-12-14 | Discharge: 2023-12-14 | Disposition: A | Discharge status: Home / Self Care | Payer: Medicare HMO | Source: Ambulatory Visit | Attending: Nurse Practitioner | Admitting: Nurse Practitioner

## 2023-12-14 ENCOUNTER — Encounter (HOSPITAL_COMMUNITY): Payer: Self-pay

## 2023-12-14 DIAGNOSIS — Z1231 Encounter for screening mammogram for malignant neoplasm of breast: Secondary | ICD-10-CM | POA: Diagnosis not present

## 2023-12-20 DIAGNOSIS — E039 Hypothyroidism, unspecified: Secondary | ICD-10-CM | POA: Diagnosis not present

## 2024-01-03 DIAGNOSIS — L910 Hypertrophic scar: Secondary | ICD-10-CM | POA: Diagnosis not present

## 2024-01-03 DIAGNOSIS — L438 Other lichen planus: Secondary | ICD-10-CM | POA: Diagnosis not present

## 2024-03-06 DIAGNOSIS — E039 Hypothyroidism, unspecified: Secondary | ICD-10-CM | POA: Diagnosis not present

## 2024-03-06 DIAGNOSIS — I10 Essential (primary) hypertension: Secondary | ICD-10-CM | POA: Diagnosis not present

## 2024-03-13 DIAGNOSIS — L989 Disorder of the skin and subcutaneous tissue, unspecified: Secondary | ICD-10-CM | POA: Diagnosis not present

## 2024-03-13 DIAGNOSIS — E559 Vitamin D deficiency, unspecified: Secondary | ICD-10-CM | POA: Diagnosis not present

## 2024-03-13 DIAGNOSIS — E782 Mixed hyperlipidemia: Secondary | ICD-10-CM | POA: Diagnosis not present

## 2024-03-13 DIAGNOSIS — N1831 Chronic kidney disease, stage 3a: Secondary | ICD-10-CM | POA: Diagnosis not present

## 2024-03-13 DIAGNOSIS — E039 Hypothyroidism, unspecified: Secondary | ICD-10-CM | POA: Diagnosis not present

## 2024-03-13 DIAGNOSIS — M25562 Pain in left knee: Secondary | ICD-10-CM | POA: Diagnosis not present

## 2024-03-13 DIAGNOSIS — R7401 Elevation of levels of liver transaminase levels: Secondary | ICD-10-CM | POA: Diagnosis not present

## 2024-03-13 DIAGNOSIS — I10 Essential (primary) hypertension: Secondary | ICD-10-CM | POA: Diagnosis not present

## 2024-03-13 DIAGNOSIS — R7303 Prediabetes: Secondary | ICD-10-CM | POA: Diagnosis not present

## 2024-03-13 DIAGNOSIS — I129 Hypertensive chronic kidney disease with stage 1 through stage 4 chronic kidney disease, or unspecified chronic kidney disease: Secondary | ICD-10-CM | POA: Diagnosis not present

## 2024-03-13 DIAGNOSIS — M25561 Pain in right knee: Secondary | ICD-10-CM | POA: Diagnosis not present

## 2024-06-20 DIAGNOSIS — R040 Epistaxis: Secondary | ICD-10-CM | POA: Diagnosis not present

## 2024-06-20 DIAGNOSIS — E559 Vitamin D deficiency, unspecified: Secondary | ICD-10-CM | POA: Diagnosis not present

## 2024-06-20 DIAGNOSIS — M25561 Pain in right knee: Secondary | ICD-10-CM | POA: Diagnosis not present

## 2024-06-20 DIAGNOSIS — I129 Hypertensive chronic kidney disease with stage 1 through stage 4 chronic kidney disease, or unspecified chronic kidney disease: Secondary | ICD-10-CM | POA: Diagnosis not present

## 2024-06-20 DIAGNOSIS — I10 Essential (primary) hypertension: Secondary | ICD-10-CM | POA: Diagnosis not present

## 2024-06-20 DIAGNOSIS — N1831 Chronic kidney disease, stage 3a: Secondary | ICD-10-CM | POA: Diagnosis not present

## 2024-06-20 DIAGNOSIS — R7303 Prediabetes: Secondary | ICD-10-CM | POA: Diagnosis not present

## 2024-06-20 DIAGNOSIS — L989 Disorder of the skin and subcutaneous tissue, unspecified: Secondary | ICD-10-CM | POA: Diagnosis not present

## 2024-06-20 DIAGNOSIS — E039 Hypothyroidism, unspecified: Secondary | ICD-10-CM | POA: Diagnosis not present

## 2024-06-20 DIAGNOSIS — R7401 Elevation of levels of liver transaminase levels: Secondary | ICD-10-CM | POA: Diagnosis not present

## 2024-06-20 DIAGNOSIS — M25562 Pain in left knee: Secondary | ICD-10-CM | POA: Diagnosis not present

## 2024-06-20 DIAGNOSIS — E782 Mixed hyperlipidemia: Secondary | ICD-10-CM | POA: Diagnosis not present

## 2024-07-23 DIAGNOSIS — E039 Hypothyroidism, unspecified: Secondary | ICD-10-CM | POA: Diagnosis not present

## 2024-10-08 DIAGNOSIS — E0590 Thyrotoxicosis, unspecified without thyrotoxic crisis or storm: Secondary | ICD-10-CM | POA: Diagnosis not present

## 2024-10-08 DIAGNOSIS — I10 Essential (primary) hypertension: Secondary | ICD-10-CM | POA: Diagnosis not present

## 2024-10-08 DIAGNOSIS — E782 Mixed hyperlipidemia: Secondary | ICD-10-CM | POA: Diagnosis not present

## 2024-10-08 DIAGNOSIS — E039 Hypothyroidism, unspecified: Secondary | ICD-10-CM | POA: Diagnosis not present

## 2024-11-08 ENCOUNTER — Emergency Department (HOSPITAL_COMMUNITY)
Admission: EM | Admit: 2024-11-08 | Discharge: 2024-11-08 | Disposition: A | Discharge status: Home / Self Care | Attending: Emergency Medicine | Admitting: Emergency Medicine

## 2024-11-08 ENCOUNTER — Encounter (HOSPITAL_COMMUNITY): Payer: Self-pay

## 2024-11-08 ENCOUNTER — Other Ambulatory Visit: Payer: Self-pay

## 2024-11-08 ENCOUNTER — Emergency Department (HOSPITAL_COMMUNITY)

## 2024-11-08 DIAGNOSIS — M5412 Radiculopathy, cervical region: Secondary | ICD-10-CM | POA: Diagnosis not present

## 2024-11-08 DIAGNOSIS — M542 Cervicalgia: Secondary | ICD-10-CM | POA: Diagnosis present

## 2024-11-08 MED ORDER — HYDROCODONE-ACETAMINOPHEN 5-325 MG PO TABS
1.0000 | ORAL_TABLET | Freq: Once | ORAL | Status: AC
  Administered 2024-11-08: 1 via ORAL
  Filled 2024-11-08: qty 1

## 2024-11-08 MED ORDER — LIDOCAINE 5 % EX PTCH
1.0000 | MEDICATED_PATCH | CUTANEOUS | Status: DC
  Administered 2024-11-08: 1 via TRANSDERMAL
  Filled 2024-11-08: qty 1

## 2024-11-08 NOTE — ED Provider Notes (Signed)
 " [place] EMERGENCY DEPARTMENT AT [facility] Provider Note   CSN: [id number] Arrival date & time: 11/08/24  9386     Patient presents with: Neck Pain   [name] is a 74 y.o. female.   74 year old female who presents to the emergency department with neck and arm pain.  Reports that for the past 2 days she has been having burning and shooting pains that go from her neck down her right arm.  Says that last night she had sharp pains in her right hand.  No weakness or numbness of the hand otherwise.  No weakness or numbness of her arms or legs.  No bowel or bladder incontinence.  No headache.  No history of cancer.  Not on blood thinners.  No history of neck surgery.       Prior to Admission medications  Medication Sig Start Date End Date Taking? Authorizing Provider  Ascorbic Acid (VITAMIN C) 100 MG tablet Take 100 mg by mouth daily.    [provider]  atorvastatin (LIPITOR) 10 MG tablet Take 10 mg by mouth at bedtime. 05/31/22   [provider]  levothyroxine  (SYNTHROID , LEVOTHROID) 88 MCG tablet Take 1 tablet (88 mcg total) by mouth daily. Patient taking differently: Take 75 mcg by mouth daily. 01/01/19   Jordan, Betty G, MD  lisinopril -hydrochlorothiazide  (PRINZIDE ,ZESTORETIC ) 10-12.5 MG tablet Take 1 tablet by mouth daily. 01/05/19   Jordan, Betty G, MD  Vitamin D, Ergocalciferol, (DRISDOL) 1.25 MG (50000 UNIT) CAPS capsule Take 50,000 Units by mouth once a week. 07/20/22   [provider]    Allergies: Patient has no known allergies.    Review of Systems  Updated Vital Signs BP 115/60   Pulse (!) 51   Temp 97.7 F (36.5 C)   Resp 18   Ht 5' 6 (1.676 m)   Wt 79.8 kg   SpO2 96%   BMI 28.40 kg/m   Physical Exam Constitutional:      Appearance: Normal appearance.  Eyes:     Extraocular Movements: Extraocular movements intact.     Conjunctiva/sclera: Conjunctivae normal.     Pupils: Pupils are equal, round, and reactive to  light.  Cardiovascular:     Comments: Radial pulses 2+ bilaterally.  Right hand appears warm and well-perfused. Neurological:     Mental Status: She is alert.     Comments: MENTAL STATUS: AAOx3 CRANIAL NERVES: II: Pupils equal and reactive 4 mm BL, no RAPD, no VF deficits III, IV, VI: EOM intact, no gaze preference or deviation, no nystagmus. V: normal sensation to light touch in V1, V2, and V3 segments bilaterally VII: no facial weakness or asymmetry, no nasolabial fold flattening VIII: normal hearing to speech and finger friction IX, X: normal palatal elevation, no uvular deviation XI: 5/5 head turn and 5/5 shoulder shrug bilaterally XII: midline tongue protrusion MOTOR: 5/5 strength in R shoulder flexion, elbow flexion and extension, and grip strength. 5/5 strength in L shoulder flexion, elbow flexion and extension, and grip strength.  5/5 strength in R hip and knee flexion, knee extension, ankle plantar and dorsiflexion. 5/5 strength in L hip and knee flexion, knee extension, ankle plantar and dorsiflexion. SENSORY: Normal sensation to light touch in all extremities COORD: Normal finger to nose and heel to shin, no tremor, no dysmetria  No midline C-spine tenderness to palpation.  Motor: Muscle bulk and tone are normal. Strength is 5/5 in shoulder abduction, elbow flexion and extension, grip strength, hip flexion, knee flexion  and extension, ankle dorsiflexion and plantar flexion on the right arm.  Sensory: Intact sensation to light touch in C5-T1 dermatomes on the right      (all labs ordered are listed, but only abnormal results are displayed) Labs Reviewed - No data to display  EKG: None  Radiology: DG Cervical Spine Complete Result Date: 11/08/2024 CLINICAL DATA:  Neck pain with right upper extremity radiculopathy. No injury. EXAM: CERVICAL SPINE - COMPLETE 4+ VIEW COMPARISON:  None Available. FINDINGS: Vertebral body alignment and heights are normal. There is mild  spondylosis of the mid to lower cervical spine to include uncovertebral joint spurring and facet arthropathy. Atlantoaxial articulation is normal. Prevertebral soft tissues are normal. There is disc space narrowing at the C5-6 level and to lesser extent at the C6-7 level. No significant neural foraminal narrowing. IMPRESSION: Mild spondylosis of the mid to lower cervical spine with disc disease at the C5-6 and C6-7 levels. Electronically Signed   By: Toribio Agreste M.D.   On: 11/08/2024 08:20     Procedures   Medications Ordered in the ED  lidocaine  (LIDODERM ) 5 % 1 patch (1 patch Transdermal Patch Applied 11/08/24 0730)  HYDROcodone -acetaminophen  (NORCO/VICODIN) 5-325 MG per tablet 1 tablet (1 tablet Oral Given 11/08/24 0729)                                    Medical Decision Making Amount and/or Complexity of Data Reviewed Radiology: ordered.  Risk Prescription drug management.   [name] is a 74 year old female who presents to the emergency department with neck and arm pain.    Initial Ddx:  cervical radiculopathy, spinal cord compression, pathologic fracture, spinal epidural abscess, spinal epidural hematoma  MDM:  Feel the patient likely has cervical radiculopathy given their symptoms.  No signs or symptoms of cord compression such as bowel or bladder incontinence or numbness or weakness that would warrant MRI at this time.  Given her age will obtain x-ray to rule out pathologic fracture but feel this is less likely.  No risk factors for spinal epidural abscess or spinal epidural hematoma either.  Plan:  Pain medication X-ray  ED Summary/Re-evaluation:  X-ray shows degenerative disc disease in the cervical spine as well as other age-related changes.  Upon reassessment patient reports that her pain is resolved.  Given information to follow-up with spine surgery since I suspect that her symptoms are coming from cervical radiculopathy.  Says she would prefer to use Tylenol   and OTC topical treatments for the pain  This patient presents to the ED for concern of complaints listed in HPI, this involves an extensive number of treatment options, and is a complaint that carries with it a high risk of complications and morbidity. Disposition including potential need for admission considered.   Dispo: DC Home. Return precautions discussed including, but not limited to, those listed in the AVS. Allowed pt time to ask questions which were answered fully prior to dc.  Records reviewed Outpatient Clinic Notes I have reviewed the patients home medications and made adjustments as needed Social Determinants of health:  Geriatric   Final diagnoses:  Cervical radiculopathy    ED Discharge Orders     None          Yolande Lamar BROCKS, MD 11/08/24 1048  "

## 2024-11-08 NOTE — ED Notes (Signed)
 See triage notes. A/o rom wnl. States pain worse with certain movement.

## 2024-11-08 NOTE — ED Triage Notes (Signed)
 Pov form home. Cc of right side neck and pain that has caused numbness and tingling in her right arm and hand.  Has been going on 2 days and worsening

## 2024-11-08 NOTE — Discharge Instructions (Signed)
 You were seen for your neck pain in the emergency department.   At home, please use over-the-counter Tylenol  and lidocaine  patches.   Follow-up with your primary doctor in 2-3 days regarding your visit.  Follow-up with the spine clinic as soon as possible regarding your symptoms.  Return immediately to the emergency department if you experience any of the following: Numbness or weakness of your arms or legs, bowel or bladder incontinence, numbness while wiping after pooping or urinating, or any other concerning symptoms.    Thank you for visiting our Emergency Department. It was a pleasure taking care of you today.

## 2024-11-12 ENCOUNTER — Other Ambulatory Visit (HOSPITAL_COMMUNITY): Payer: Self-pay | Admitting: Nurse Practitioner

## 2024-11-12 DIAGNOSIS — Z1231 Encounter for screening mammogram for malignant neoplasm of breast: Secondary | ICD-10-CM

## 2024-12-14 ENCOUNTER — Ambulatory Visit (HOSPITAL_COMMUNITY)
